# Patient Record
Sex: Female | Born: 1980
Health system: Southern US, Community
[De-identification: ages and names within clinical notes are randomized; demographics above are authoritative.]

## PROBLEM LIST (undated history)

## (undated) DIAGNOSIS — IMO0002 Reserved for concepts with insufficient information to code with codable children: Secondary | ICD-10-CM

## (undated) DIAGNOSIS — B019 Varicella without complication: Secondary | ICD-10-CM

## (undated) DIAGNOSIS — F1491 Cocaine use, unspecified, in remission: Secondary | ICD-10-CM

## (undated) DIAGNOSIS — F419 Anxiety disorder, unspecified: Secondary | ICD-10-CM

## (undated) DIAGNOSIS — J45909 Unspecified asthma, uncomplicated: Secondary | ICD-10-CM

## (undated) DIAGNOSIS — F1290 Cannabis use, unspecified, uncomplicated: Secondary | ICD-10-CM

## (undated) DIAGNOSIS — Z9289 Personal history of other medical treatment: Secondary | ICD-10-CM

## (undated) DIAGNOSIS — F32A Depression, unspecified: Secondary | ICD-10-CM

## (undated) DIAGNOSIS — F17210 Nicotine dependence, cigarettes, uncomplicated: Secondary | ICD-10-CM

## (undated) DIAGNOSIS — E538 Deficiency of other specified B group vitamins: Secondary | ICD-10-CM

## (undated) DIAGNOSIS — E559 Vitamin D deficiency, unspecified: Secondary | ICD-10-CM

## (undated) DIAGNOSIS — F329 Major depressive disorder, single episode, unspecified: Secondary | ICD-10-CM

## (undated) HISTORY — DX: Reserved for concepts with insufficient information to code with codable children: IMO0002

## (undated) HISTORY — DX: Cannabis use, unspecified, uncomplicated: F12.90

## (undated) HISTORY — DX: Varicella without complication: B01.9

## (undated) HISTORY — DX: Personal history of other medical treatment: Z92.89

## (undated) HISTORY — DX: Depression, unspecified: F32.A

## (undated) HISTORY — DX: Unspecified asthma, uncomplicated: J45.909

## (undated) HISTORY — DX: Cocaine use, unspecified, in remission: F14.91

## (undated) HISTORY — DX: Major depressive disorder, single episode, unspecified: F32.9

## (undated) HISTORY — PX: WISDOM TOOTH EXTRACTION: SHX21

## (undated) HISTORY — PX: CYSTECTOMY: SUR359

## (undated) HISTORY — DX: Vitamin D deficiency, unspecified: E55.9

## (undated) HISTORY — DX: Deficiency of other specified B group vitamins: E53.8

## (undated) HISTORY — DX: Nicotine dependence, cigarettes, uncomplicated: F17.210

---
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
## 2000-01-21 ENCOUNTER — Encounter: Payer: Self-pay | Admitting: Emergency Medicine

## 2000-01-21 ENCOUNTER — Emergency Department (HOSPITAL_COMMUNITY): Admission: EM | Admit: 2000-01-21 | Discharge: 2000-01-21 | Payer: Self-pay | Admitting: Emergency Medicine

## 2011-01-23 DIAGNOSIS — R87619 Unspecified abnormal cytological findings in specimens from cervix uteri: Secondary | ICD-10-CM

## 2011-01-23 DIAGNOSIS — IMO0002 Reserved for concepts with insufficient information to code with codable children: Secondary | ICD-10-CM

## 2011-01-23 HISTORY — DX: Unspecified abnormal cytological findings in specimens from cervix uteri: R87.619

## 2011-01-23 HISTORY — DX: Reserved for concepts with insufficient information to code with codable children: IMO0002

## 2011-04-12 ENCOUNTER — Other Ambulatory Visit (HOSPITAL_COMMUNITY)
Admission: RE | Admit: 2011-04-12 | Discharge: 2011-04-12 | Disposition: A | Discharge status: Home / Self Care | Payer: Self-pay | Source: Ambulatory Visit | Attending: Physician Assistant | Admitting: Physician Assistant

## 2011-04-12 ENCOUNTER — Encounter: Payer: Self-pay | Admitting: Physician Assistant

## 2011-04-12 ENCOUNTER — Ambulatory Visit (INDEPENDENT_AMBULATORY_CARE_PROVIDER_SITE_OTHER): Payer: Self-pay | Admitting: Physician Assistant

## 2011-04-12 VITALS — BP 133/80 | HR 92 | Temp 97.2°F | Ht 62.0 in | Wt 117.0 lb

## 2011-04-12 DIAGNOSIS — Z01812 Encounter for preprocedural laboratory examination: Secondary | ICD-10-CM

## 2011-04-12 DIAGNOSIS — IMO0002 Reserved for concepts with insufficient information to code with codable children: Secondary | ICD-10-CM

## 2011-04-12 DIAGNOSIS — D069 Carcinoma in situ of cervix, unspecified: Secondary | ICD-10-CM | POA: Insufficient documentation

## 2011-04-12 DIAGNOSIS — N871 Moderate cervical dysplasia: Secondary | ICD-10-CM | POA: Insufficient documentation

## 2011-04-12 DIAGNOSIS — R87613 High grade squamous intraepithelial lesion on cytologic smear of cervix (HGSIL): Secondary | ICD-10-CM

## 2011-04-12 HISTORY — DX: High grade squamous intraepithelial lesion on cytologic smear of cervix (HGSIL): R87.613

## 2011-04-12 HISTORY — DX: Reserved for concepts with insufficient information to code with codable children: IMO0002

## 2011-04-12 LAB — POCT PREGNANCY, URINE: Preg Test, Ur: NEGATIVE

## 2011-04-12 NOTE — Progress Notes (Signed)
Referred from [facility] for abnormal pap. 02/2011 pap showed HSIL. Pt reports hx HSIL 2010 Claris Gower w/o tx) Patient given informed consent, signed copy in the chart, time out was performed.  Placed in lithotomy position. Cervix viewed with speculum and colposcope after application of acetic acid.   Colposcopy adequate?  Yes  Acetowhite lesions?Yes Punctation?Yes Mosaicism?  Yes; from 6-9 o'clock Abnormal vasculature?  Yes; 12 o'clock Biopsies?Yes x 2 ECC?Yes  COMMENTS:Suspect CIN 3 Patient was given post procedure instructions.  She will return in 2 weeks for results. Discussed with pt probable CIN 3, suspect need for LEEP.

## 2011-04-12 NOTE — Patient Instructions (Signed)
Colposcopy Care After Colposcopy is a procedure in which a special tool is used to magnify the surface of the cervix. A tissue sample (biopsy) may also be taken. This sample will be looked at for cervical cancer or other problems. After the test:  You may have some cramping.   Lie down for a few minutes if you feel lightheaded.    You may have some bleeding which should stop in a few days.  HOME CARE  Do not have sex or use tampons for 2 to 3 days or as told.   Only take medicine as told by your doctor.   Continue to take your birth control pills as usual.  Finding out the results of your test Ask when your test results will be ready. Make sure you get your test results. GET HELP RIGHT AWAY IF:  You are bleeding a lot or are passing blood clots.   You develop a fever of 102 F (38.9 C) or higher.   You have abnormal vaginal discharge.   You have cramps that do not go away with medicine.   You feel lightheaded, dizzy, or pass out (faint).  MAKE SURE YOU:   Understand these instructions.   Will watch your condition.   Will get help right away if you are not doing well or get worse.  Document Released: 10/27/2007 Document Revised: 01/20/2011 Document Reviewed: 10/27/2007 ExitCare Patient Information 2012 [employer]. 

## 2011-05-10 ENCOUNTER — Encounter: Payer: Self-pay | Admitting: Family Medicine

## 2011-05-10 ENCOUNTER — Ambulatory Visit (INDEPENDENT_AMBULATORY_CARE_PROVIDER_SITE_OTHER): Payer: Self-pay | Admitting: Family Medicine

## 2011-05-10 VITALS — BP 121/76 | HR 81 | Temp 97.1°F | Ht 62.0 in | Wt 118.1 lb

## 2011-05-10 DIAGNOSIS — D069 Carcinoma in situ of cervix, unspecified: Secondary | ICD-10-CM

## 2011-05-10 NOTE — Progress Notes (Signed)
Discussed colpo results: CIN 2-3.  Had some spotting and discharge for 3 days after colpo.  Nothing since.    Vitals reviewed.   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  No other questions.  Will schedule next available leep.

## 2011-05-10 NOTE — Patient Instructions (Signed)
Loop Electrosurgical Excision Procedure Loop electrosurgical excision procedure (LEEP) is the removal of a portion of the lower part of the uterus (cervix). The procedure is done when there are significantly abnormal cervical cell changes. Abnormal cell changes of the cervix can lead to cancer if left in place and untreated.  The LEEP procedure itself typically only takes a few minutes. Often, it may be done in your caregiver's office. The procedure is considered safe for those who wish to get pregnant or are trying to get pregnant. Only under rare circumstances should this procedure be done if you are pregnant. LET YOUR CAREGIVER KNOW ABOUT:  Whether you are pregnant or late for your last menstrual period.   Allergies to foods or medicines.   All the medicines you are taking includingherbs, eyedrops, and over-the-counter medicines, and creams.   Use of steroids (by mouth or creams).   Previous problems with anesthetics or numbing medicine.   Previous gynecological surgery.   History of blood clots or bleeding problems.   Any recent or current vaginal infections (herpes, sexually transmitted infections).   Other health problems.  RISKS AND COMPLICATIONS  Bleeding.   Infection.   Injury to the vagina, bladder, or rectum.   Very rare obstruction of the cervical opening that causes problems during menstruation (cervical stenosis).  BEFORE THE PROCEDURE  Do not take aspirin or blood thinners (anticoagulants) for 1 week before the procedure, or as told by your caregiver.   Eat a light meal before the procedure.   Ask your caregiver about changing or stopping your regular medicines.   You may be given a pain reliever 1 or 2 hours before the procedure.  PROCEDURE   A tool (speculum) is placed in the vagina. This allows your caregiver to see the cervix.   An iodine stain is applied to the cervix to find the area of abnormal cells to be removed.   Medicine is injected to numb  the cervix (local anesthetic).    Electricity is passed through a thin wire loop which is then used to remove (cauterize) a small segment of the affected cervix.   Light electrocautery is used to seal any small blood vessels and prevent bleeding.   A paste may be applied to the cauterized area of the cervix to help prevent bleeding.   The tissue sample is sent to the lab. It is examined under the microscope.  AFTER THE PROCEDURE  Have someone drive you home.   You may have slight to moderate cramping.   You may notice a black vaginal discharge from the paste used on the cervix to prevent bleeding. This is normal.   Watch for excessive bleeding. This requires immediate medical care.   Ask when your test results will be ready. Make sure you get your test results.  Document Released: 07/31/2002 Document Revised: 01/20/2011 Document Reviewed: 10/20/2010 ExitCare Patient Information 2012 [employer]. 

## 2011-05-24 ENCOUNTER — Encounter: Payer: Self-pay | Admitting: Obstetrics & Gynecology

## 2011-06-24 ENCOUNTER — Ambulatory Visit (INDEPENDENT_AMBULATORY_CARE_PROVIDER_SITE_OTHER): Payer: Self-pay | Admitting: Family Medicine

## 2011-06-24 ENCOUNTER — Other Ambulatory Visit (HOSPITAL_COMMUNITY)
Admission: RE | Admit: 2011-06-24 | Discharge: 2011-06-24 | Disposition: A | Discharge status: Home / Self Care | Payer: Self-pay | Source: Ambulatory Visit | Attending: Family Medicine | Admitting: Family Medicine

## 2011-06-24 ENCOUNTER — Encounter: Payer: Self-pay | Admitting: Family Medicine

## 2011-06-24 VITALS — BP 133/74 | HR 73 | Temp 98.4°F | Ht 62.0 in | Wt 112.9 lb

## 2011-06-24 DIAGNOSIS — D069 Carcinoma in situ of cervix, unspecified: Secondary | ICD-10-CM

## 2011-06-24 DIAGNOSIS — N871 Moderate cervical dysplasia: Secondary | ICD-10-CM | POA: Insufficient documentation

## 2011-06-24 LAB — POCT PREGNANCY, URINE: Preg Test, Ur: NEGATIVE

## 2011-06-24 NOTE — Patient Instructions (Signed)
Loop Electrosurgical Excision Procedure Care After Refer to this sheet in the next few weeks. These instructions provide you with information on caring for yourself after your procedure. Your caregiver may also give you more specific instructions. Your treatment has been planned according to current medical practices, but problems sometimes occur. Call your caregiver if you have any problems or questions after your procedure. HOME CARE INSTRUCTIONS   Do not use tampons, douche, or have sexual intercourse for 2 weeks or as directed by your caregiver.   Begin normal activities if you have no or minimal cramping or bleeding, unless directed otherwise by your caregiver.   Take your temperature if you feel sick. Write down your temperature on paper, and tell your caregiver if you have a fever.   Take all medicines as directed by your caregiver.   Keep all your follow-up appointments and Pap tests as directed by your caregiver.  SEEK IMMEDIATE MEDICAL CARE IF:   You have bleeding that is heavier or longer than a normal menstrual cycle.   You have bleeding that is bright red.   You have blood clots.   You have a fever.   You have increasing cramps or pain not relieved by medicine.   You develop abdominal pain that does not seem to be related to the same area of earlier cramping and pain.   You are lightheaded, unusually weak, or faint.   You develop painful or bloody urination.   You develop a bad smelling vaginal discharge.  MAKE SURE YOU:  Understand these instructions.   Will watch your condition.   Will get help right away if you are not doing well or get worse.  Document Released: 01/21/2011 Document Reviewed: 10/29/2010 ExitCare Patient Information 2012 [employer]. 

## 2011-06-24 NOTE — Progress Notes (Signed)
  LEEP PROCEDURE NOTE Pap smear and colposcopy reviewed.   Pap HSIL Colpo Biopsy CIN 2-3 ECC Neg Risks, benefits, alternatives, and limitations of procedure explained to patient, including pain, bleeding, infection, failure to remove abnormal tissue and failure to cure dysplasia, need for repeat procedures, damage to pelvic organs, cervical incompetence.  Role of HPV,cervical dysplasia and need for close followup was empasized. Informed written consent was obtained. All questions were answered. Time out performed.  ??Procedure: The patient was placed in lithotomy position and the bivalved coated speculum was placed in the patient's vagina. A grounding pad placed on the patient. Lugol's solution was applied to the cervix and areas of decreased uptake were noted 6-7, 11 o'clock.   Local anesthesia was administered via an intracervical block using 10cc of 2% Lidocaine with epinephrine. The suction was turned on and the Large 1X Fisher Cone Biopsy Excisor on 50 Watts of cutting current was used to excise the area of decreased uptake and excise the entire transformation zone. Excellent hemostasis was achieved using roller ball coagulation set at 50 Watts coagulation current. Monsel's solution was then applied and the speculum was removed from the vagina. Specimens were sent to pathology. ?The patient tolerated the procedure well. Post-operative instructions given to patient, including instruction to seek medical attention for persistent bright red bleeding, fever, abdominal/pelvic pain, dysuria, nausea or vomiting. She was also told about the possibility of having copious yellow to black tinged discharge. She was counseled to avoid anything in the vagina (sex/douching/tampons) for 4-6 weeks. She has a  2-week post-operative check to review results and assess wound healing. Follow up in 4 months for repeat pap or as needed.

## 2011-07-26 ENCOUNTER — Encounter: Payer: Self-pay | Admitting: Family Medicine

## 2011-07-26 ENCOUNTER — Ambulatory Visit (INDEPENDENT_AMBULATORY_CARE_PROVIDER_SITE_OTHER): Payer: Self-pay | Admitting: Family Medicine

## 2011-07-26 VITALS — BP 130/73 | HR 84 | Ht 62.0 in | Wt 114.5 lb

## 2011-07-26 DIAGNOSIS — N871 Moderate cervical dysplasia: Secondary | ICD-10-CM

## 2011-07-26 NOTE — Progress Notes (Signed)
  Subjective:    Patient ID: Alexandria Gutierrez Doctor, female    DOB: [birth year], 31 y.o.   MRN: [id number]  HPI Patient seen - no current bleeding, discharge, pain.  Did have discharge for a few days after the LEEP.  No concerns today.   Review of Systems     Objective:   Physical Exam  Constitutional: She is oriented to person, place, and time. She appears well-developed and well-nourished.  Neurological: She is alert and oriented to person, place, and time.  Skin: Skin is warm and dry.  Psychiatric: She has a normal mood and affect. Her behavior is normal. Judgment and thought content normal.       Assessment & Plan:  1.  CIN2 with preceding  Explained results of the LEEP with patient.  Will have patient return for PAP in 6 and 12 months.  If both are negative, would resume normal screening.  If either are ASCUS, LGSIL, or HGSIL, would need colposcopy.

## 2011-07-26 NOTE — Assessment & Plan Note (Deleted)
On LEEP done 06/24/11

## 2012-01-19 ENCOUNTER — Ambulatory Visit: Payer: Self-pay | Admitting: Family Medicine

## 2012-02-14 ENCOUNTER — Encounter: Payer: Self-pay | Admitting: Advanced Practice Midwife

## 2012-02-14 ENCOUNTER — Encounter: Payer: Self-pay | Admitting: *Deleted

## 2012-02-14 ENCOUNTER — Ambulatory Visit (INDEPENDENT_AMBULATORY_CARE_PROVIDER_SITE_OTHER): Payer: Self-pay | Admitting: Advanced Practice Midwife

## 2012-02-14 VITALS — BP 112/73 | HR 68 | Temp 97.9°F | Ht 62.0 in | Wt 119.0 lb

## 2012-02-14 DIAGNOSIS — R6889 Other general symptoms and signs: Secondary | ICD-10-CM

## 2012-02-14 DIAGNOSIS — N871 Moderate cervical dysplasia: Secondary | ICD-10-CM

## 2012-02-14 DIAGNOSIS — IMO0002 Reserved for concepts with insufficient information to code with codable children: Secondary | ICD-10-CM

## 2012-02-14 NOTE — Patient Instructions (Signed)
Pap Test A Pap test is a sampling of cells from a woman's cervix. The cervix is the opening between the vagina (birth canal) and the uterus (the bottom part of the womb). The cells are scraped from the cervix during a pelvic exam. These cells are then looked at under a microscope to see if the cells are normal or to see if a cancer is developing or there are changes that suggest a cancer will develop. Cervical dysplasia is a condition in which a woman has abnormal changes in the top layer of cells of her cervix. These changes are an early sign that cervical cancer may develop. Pap tests also look for the human papilloma virus (HPV) because it has 4 types that are responsible for 70% of cervical cancer. Infections can also be found during a Pap test such as bacteria, fungus, protozoa and viruses.  Cervical cancer is harder to treat and less likely to have a good outcome if left untreated. Catching the disease at an early stage leads to a better outcome. Since the Pap test was introduced 60 years ago, deaths from cervical cancer have decreased by 70%. Every woman should keep up to date with Pap tests. RISK FACTORS FOR CERVICAL CANCER INCLUDE:   Becoming sexually active before age 18.   Being the daughter of a woman who took diethylstilbestrol (DES) during pregnancy.   Having a sexual partner who has or has had cancer of the penis.   Having a sexual partner whose past partner had cervical cancer or cervical dysplasia (early cell changes which suggest a cancer may develop).   Having a weakened immune system. An example would be HIV or other immunodeficiency disorder.   Having had a sexually transmitted infection such as chlamydia, gonorrhea or HPV.   Having had an abnormal Pap or cancer of the vagina or vulva.   Having had more than one sexual partner.   A history of cervical cancer in a woman's sister or mother.   Not using condoms with new sexual partners.   Smoking.  WHO SHOULD HAVE PAP  TESTS  A Pap test is done to screen for cervical cancer.   The first Pap test should be done at age 21.   Between ages 21 and 29, Pap tests are repeated every 2 years.   Beginning at age 30, you are advised to have a Pap test every 3 years as long as your past 3 Pap tests have been normal.   Some women have medical problems that increase the chance of getting cervical cancer. Talk to your caregiver about these problems. It is especially important to talk to your caregiver if a new problem develops soon after your last Pap test. In these cases, your caregiver may recommend more frequent screening and Pap tests.   The above recommendations are the same for women who have or have not gotten the vaccine for HPV (Human Papillomavirus).   If you had a hysterectomy for a problem that was not a cancer or a condition that could lead to cancer, then you no longer need Pap tests. However, even if you no longer need a Pap test, a regular exam is a good idea to make sure no other problems are starting.    If you are between ages 65 and 70, and you have had normal Pap tests going back 10 years, you no longer need Pap tests. However, even if you no longer need a Pap test, a regular exam is a good idea   to make sure no other problems are starting.    If you have had past treatment for cervical cancer or a condition that could lead to cancer, you need Pap tests and screening for cancer for at least 20 years after your treatment.   If Pap tests have been discontinued, risk factors (such as a new sexual partner) need to be re-assessed to determine if screening should be resumed.   Some women may need screenings more often if they are at high risk for cervical cancer.  PREPARATION FOR A PAP TEST A Pap test should be performed during the weeks before the start of menstruation. Women should not douche or have sexual intercourse for 24 hours before the test. No vaginal creams, diaphragms, or tampons should be  used for 24 hours before the test. To minimize discomfort, a woman should empty her bladder just before the exam. TAKING THE PAP TEST The caregiver will perform a pelvic exam. A metal or plastic instrument (speculum) is placed in the vagina. This is done before your caregiver does a bimanual exam of your internal female organs. This instrument allows your caregiver to see the inside of the vagina and look at the cervix. A small, sterile brush is used to take a sample of cells from the internal opening of the cervix. A small wooden spatula is used to scrape the outside of the cervix. Neither of these two methods to collect cells will cause you pain. These two scrapings are placed on a glass slide or in a small bottle filled with a special liquid. The cells are looked at later under a microscope in a lab. A specialist will look at these cells and determine if the cells are normal. RESULTS OF YOUR PAP TEST  A healthy Pap test shows no abnormal cells or evidence of inflammation.   The presence of abnormally growing cells on the surface of the cervix may be reported as an abnormal Pap test. Different categories of findings are used to describe your Pap test. Your caregiver will go over the importance of these findings with you. The caregiver will then determine what follow-up is needed or when you should have your next pap test.   If you have had two or more abnormal Pap tests:   You may be asked to have a colposcopy. This is a test in which the cervix is viewed with a special lighted microscope.   A cervical tissue sample (biopsy) may also be needed. This involves taking a small tissue sample from the cervix. The sample is looked at under a microscope to find the cause of the abnormal cells. Make sure you find out the results of the Pap test. If you have not received the results within two weeks, contact your caregiver's office for the results. Do not assume everything is normal if you have not heard from  your caregiver or medical facility. It is important to follow up on all of your test results.  Document Released: 07/31/2002 Document Revised: 04/29/2011 Document Reviewed: 05/04/2011 Exit[employer] Patient Information 2012 [employer]. 

## 2012-02-14 NOTE — Progress Notes (Signed)
  Subjective:    Patient ID: Alexandria Gutierrez Doctor, female    DOB: 1980-08-31, 31 y.o.   MRN: [id number]  HPI This is a 31 y.o. female who presents for repeat pap smear. She was seen here on 06/24/11 for a LEEP procedure with Dr Adrian Blackwater. Her previous pap had shown CIN 2-3.  She has no complaints. Review of Systems Negative.    Objective:   Physical Exam EGBUS: Normal, without discharge or lesions Vagina: pink without lesions Cervix:  Pink, no lesions, there is a small amount of menstrual blood  Pap obtained with broom and brush     Assessment & Plan:  A:  History of CIN 2-3      S/P LEEP 7 months ago  P:  Pap sent      Repeat in 6 months.

## 2012-02-18 ENCOUNTER — Encounter: Payer: Self-pay | Admitting: Advanced Practice Midwife

## 2012-03-30 ENCOUNTER — Encounter (INDEPENDENT_AMBULATORY_CARE_PROVIDER_SITE_OTHER): Payer: Self-pay | Admitting: General Surgery

## 2012-03-30 ENCOUNTER — Ambulatory Visit (INDEPENDENT_AMBULATORY_CARE_PROVIDER_SITE_OTHER): Payer: Self-pay | Admitting: General Surgery

## 2012-03-30 VITALS — BP 128/82 | HR 76 | Temp 97.6°F | Resp 16 | Ht 62.0 in | Wt 117.0 lb

## 2012-03-30 DIAGNOSIS — N6019 Diffuse cystic mastopathy of unspecified breast: Secondary | ICD-10-CM

## 2012-03-30 NOTE — Progress Notes (Signed)
Subjective:     Patient ID: Alexandria Gutierrez, female   DOB: 30-Sep-1980, 31 y.o.   MRN: [id number]  HPI This patient presents today for evaluation of a palpable left breast mass first noticed last night by her husband. She says that she has noticed that her breasts have been more tender bilaterally for the last month and she has had some increased tenderness in the area but she felt that this was likely due to her menstrual cycle. She does do her self breast exams and has not noticed any changes prior to this. She does not take any hormones in she denies any family history of breast cancer. However, she does say that her mother has a history of fibrocystic breasts.  She does drink a significant amount of caffeine and is a mild smoker. Otherwise she denies any abnormal weight loss or systemic changes  Review of Systems     Objective:   Physical Exam On exam I do not appreciate any dominant mass but she does have some fullness in the area of the upper outer left breast in the area of concern. I think that this is just normal breast tissue as it does not appear to be a well-defined mass. This area is mobile and is mildly tender. This is just at the lateral border of the pectoralis muscle. I do not appreciate any other dominant masses or skin changes and there is no evidence of any lymphadenopathy. I performed a bedside ultrasound with high-frequency probe and she did have some fibrocystic breast changes both no dominant mass was seen in the area of concern. There is no visible cyst or hypo-echoic lesions.    Assessment:     Fibrocystic breast I think that this is most likely fibrocystic changes and did not appreciate a dominant mass on exam or ultrasound. However, since she does do her breast exam and has not noticed this area before, I recommended that we do a diagnostic mammogram and ultrasound in the area of concern. Recommend to continue monthly self breast exams and wearing a sports bra during the  times of tenderness. We discussed smoking and caffeine as a potential cause for breast discomfort    Plan:     We will go ahead and try to set her up for diagnostic mammograms and left breast ultrasound to further evaluate for any definite mass. 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

## 2012-04-07 ENCOUNTER — Other Ambulatory Visit: Payer: Self-pay

## 2012-06-06 ENCOUNTER — Telehealth: Payer: Self-pay | Admitting: Oncology

## 2012-06-06 NOTE — Telephone Encounter (Signed)
I left a message to schedule a consult.

## 2012-06-15 ENCOUNTER — Ambulatory Visit
Admission: RE | Admit: 2012-06-15 | Discharge: 2012-06-15 | Disposition: A | Discharge status: Home / Self Care | Payer: BC Managed Care – PPO | Source: Ambulatory Visit | Attending: General Surgery | Admitting: General Surgery

## 2012-06-15 DIAGNOSIS — N6019 Diffuse cystic mastopathy of unspecified breast: Secondary | ICD-10-CM

## 2012-10-13 ENCOUNTER — Other Ambulatory Visit (INDEPENDENT_AMBULATORY_CARE_PROVIDER_SITE_OTHER): Payer: Self-pay | Admitting: Surgery

## 2012-10-13 ENCOUNTER — Other Ambulatory Visit (INDEPENDENT_AMBULATORY_CARE_PROVIDER_SITE_OTHER): Payer: Self-pay

## 2012-10-13 ENCOUNTER — Ambulatory Visit
Admission: RE | Admit: 2012-10-13 | Discharge: 2012-10-13 | Disposition: A | Discharge status: Home / Self Care | Payer: Self-pay | Source: Ambulatory Visit | Attending: Surgery | Admitting: Surgery

## 2012-10-13 DIAGNOSIS — R7611 Nonspecific reaction to tuberculin skin test without active tuberculosis: Secondary | ICD-10-CM

## 2012-10-18 LAB — QUANTIFERON TB GOLD ASSAY (BLOOD)
Mitogen value: 5.93 IU/mL
Quantiferon Tb Ag Minus Nil Value: 3.42 IU/mL

## 2012-10-19 ENCOUNTER — Other Ambulatory Visit (INDEPENDENT_AMBULATORY_CARE_PROVIDER_SITE_OTHER): Payer: Self-pay

## 2012-10-19 ENCOUNTER — Other Ambulatory Visit (INDEPENDENT_AMBULATORY_CARE_PROVIDER_SITE_OTHER): Payer: Self-pay | Admitting: Surgery

## 2012-10-19 DIAGNOSIS — R7611 Nonspecific reaction to tuberculin skin test without active tuberculosis: Secondary | ICD-10-CM

## 2012-10-23 LAB — QUANTIFERON TB GOLD ASSAY (BLOOD)
Interferon Gamma Release Assay: POSITIVE — AB
Mitogen value: >10 IU/mL
Quantiferon Nil Value: 0.05 IU/mL

## 2012-12-07 ENCOUNTER — Ambulatory Visit (INDEPENDENT_AMBULATORY_CARE_PROVIDER_SITE_OTHER): Payer: BC Managed Care – PPO | Admitting: General Practice

## 2012-12-07 DIAGNOSIS — Z3201 Encounter for pregnancy test, result positive: Secondary | ICD-10-CM

## 2012-12-07 DIAGNOSIS — Z3200 Encounter for pregnancy test, result unknown: Secondary | ICD-10-CM

## 2012-12-29 LAB — OB RESULTS CONSOLE HEPATITIS B SURFACE ANTIGEN: HEP B S AG: NEGATIVE

## 2012-12-29 LAB — OB RESULTS CONSOLE RPR: RPR: NONREACTIVE

## 2012-12-29 LAB — OB RESULTS CONSOLE HIV ANTIBODY (ROUTINE TESTING): HIV: NONREACTIVE

## 2012-12-29 LAB — OB RESULTS CONSOLE ABO/RH: RH TYPE: NEGATIVE

## 2012-12-29 LAB — OB RESULTS CONSOLE GC/CHLAMYDIA
CHLAMYDIA, DNA PROBE: NEGATIVE
GC PROBE AMP, GENITAL: NEGATIVE

## 2012-12-29 LAB — OB RESULTS CONSOLE ANTIBODY SCREEN: Antibody Screen: NEGATIVE

## 2012-12-29 LAB — OB RESULTS CONSOLE RUBELLA ANTIBODY, IGM: RUBELLA: IMMUNE

## 2013-01-17 ENCOUNTER — Encounter: Payer: Self-pay | Admitting: Advanced Practice Midwife

## 2013-02-13 ENCOUNTER — Other Ambulatory Visit (HOSPITAL_COMMUNITY): Payer: Self-pay | Admitting: Obstetrics & Gynecology

## 2013-02-13 DIAGNOSIS — R1012 Left upper quadrant pain: Secondary | ICD-10-CM

## 2013-02-14 ENCOUNTER — Ambulatory Visit (HOSPITAL_COMMUNITY)
Admission: RE | Admit: 2013-02-14 | Discharge: 2013-02-14 | Disposition: A | Discharge status: Home / Self Care | Payer: BC Managed Care – PPO | Source: Ambulatory Visit | Attending: Obstetrics & Gynecology | Admitting: Obstetrics & Gynecology

## 2013-02-14 DIAGNOSIS — R1012 Left upper quadrant pain: Secondary | ICD-10-CM

## 2013-02-14 DIAGNOSIS — Z3689 Encounter for other specified antenatal screening: Secondary | ICD-10-CM | POA: Insufficient documentation

## 2013-02-14 DIAGNOSIS — O99891 Other specified diseases and conditions complicating pregnancy: Secondary | ICD-10-CM | POA: Insufficient documentation

## 2013-02-14 IMAGING — US US OB COMP +14 WK
1 series · 12 of 28 positions shown · non-contrast
Comparison: none

[Series 1: us ob comp +14 wk · 12 of 41 slices shown]
[im 2/41]
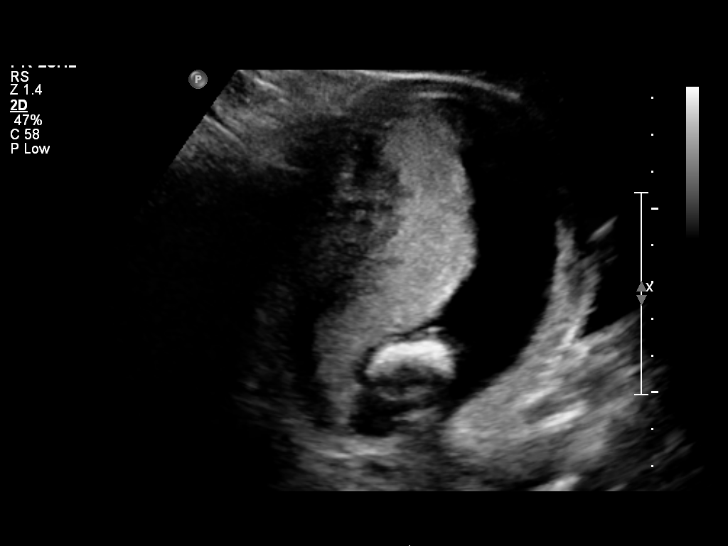
[im 5/41]
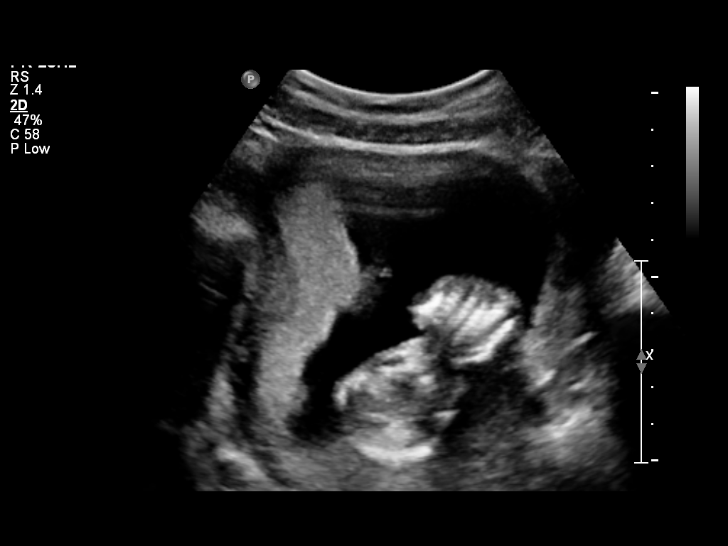
[im 8/41]
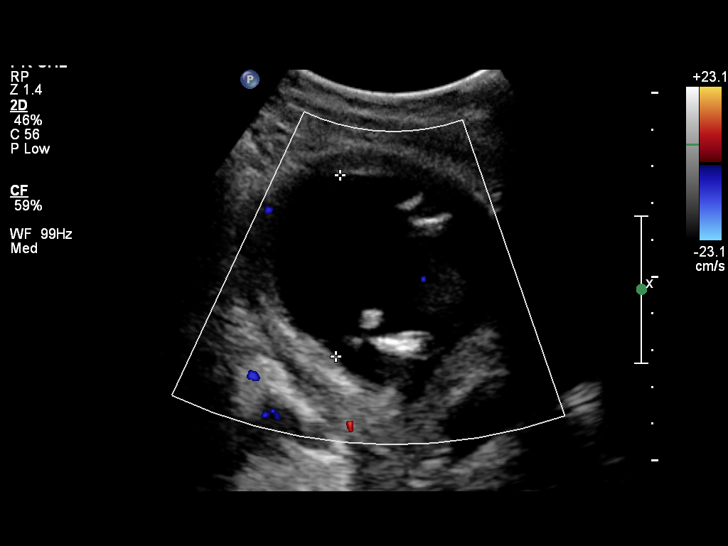
[im 12/41]
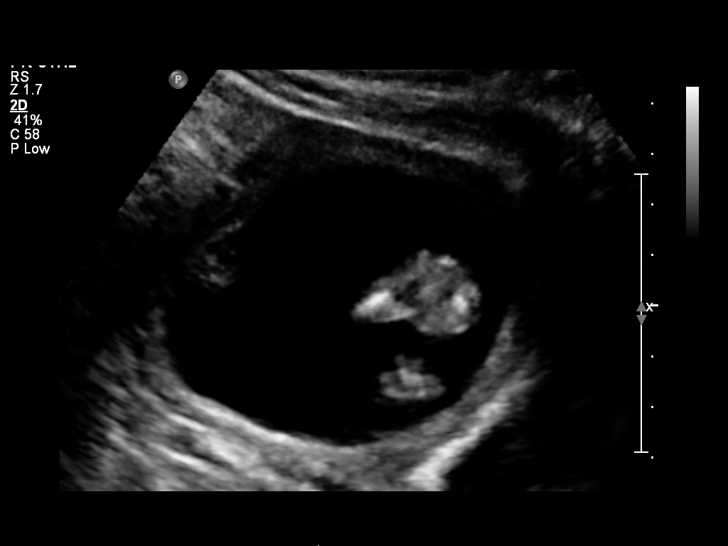
[im 15/41]
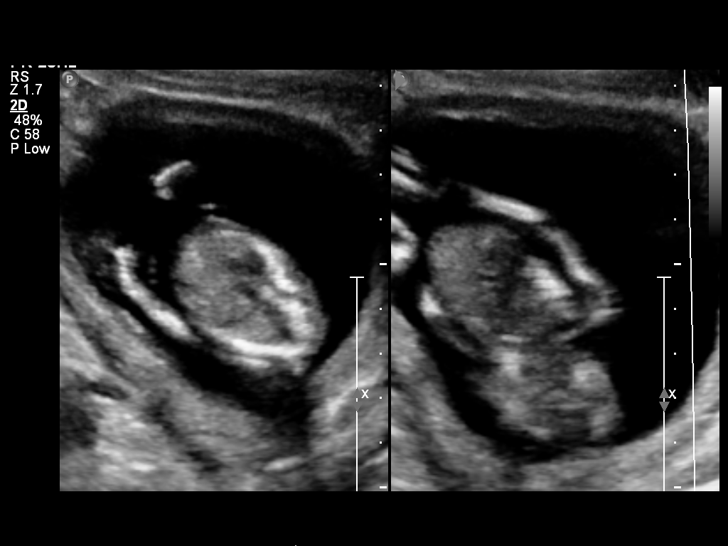
[im 18/41]
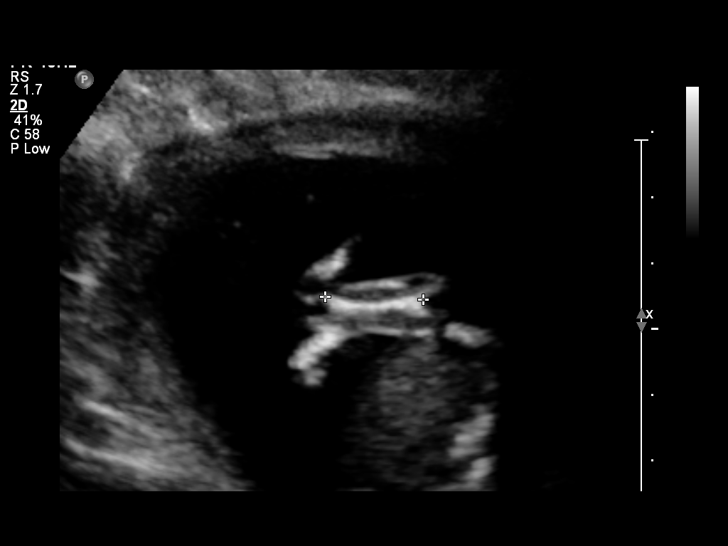
[im 23/41]
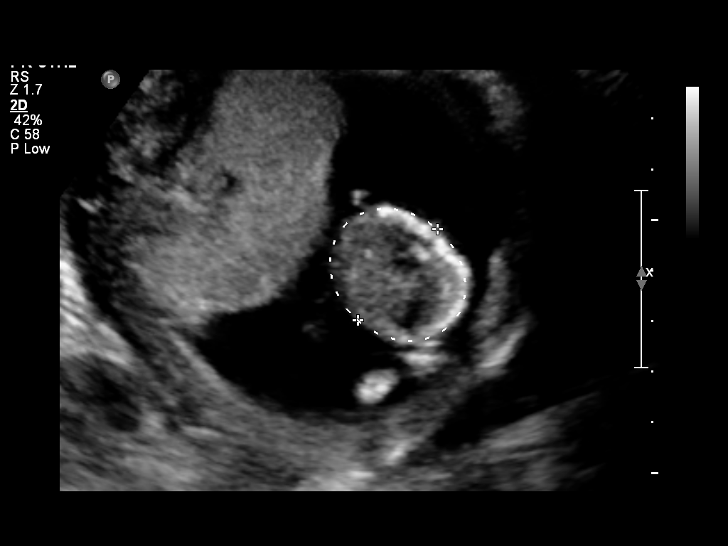
[im 26/41]
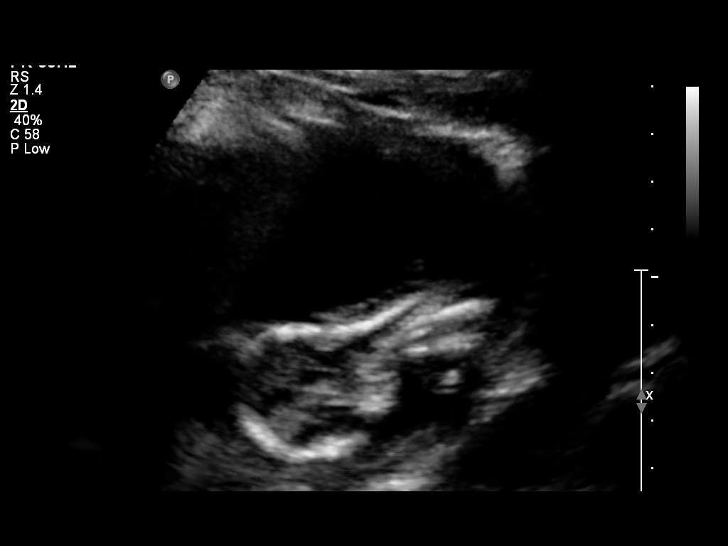
[im 29/41]
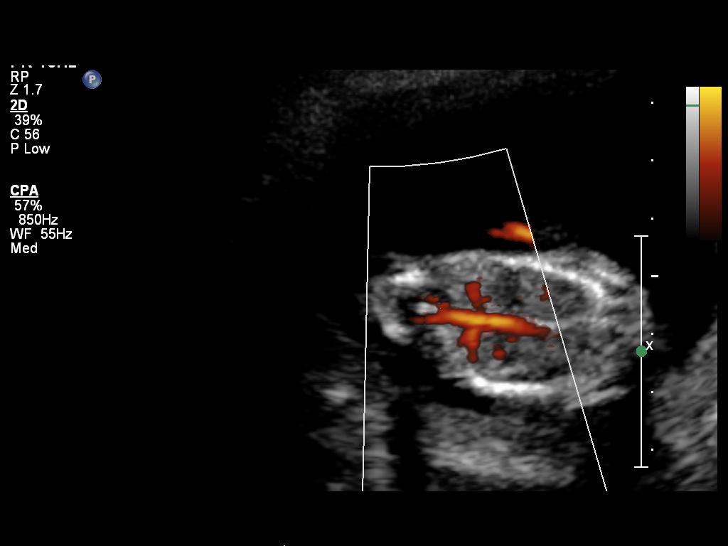
[im 33/41]
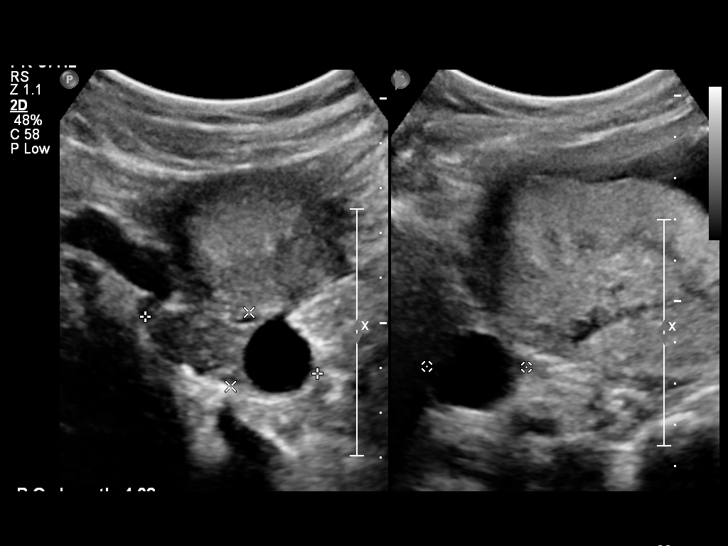
[im 36/41]
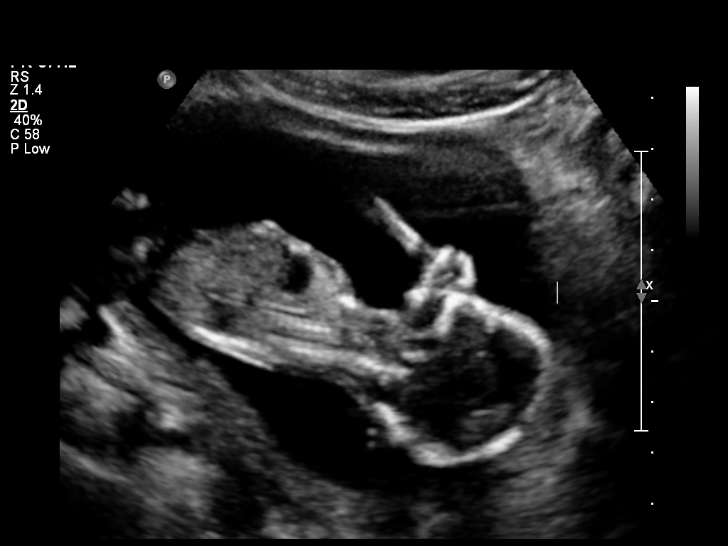
[im 39/41]
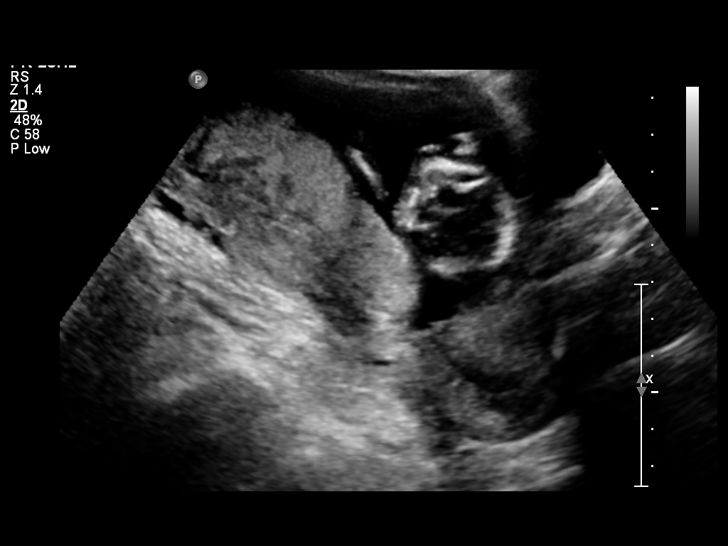

[12 of 28 positions shown; findings below may reference images not displayed]

OBSTETRICS REPORT
                      (Signed Final [DATE] [DATE])

Service(s) Provided

 US OB COMP + 14 WK                                    76805.1
Indications

 Abdominal pain - LUQ
 Basic anatomic survey                                 [23]
Fetal Evaluation

 Num Of Fetuses:    1
 Fetal Heart Rate:  152                          bpm
 Cardiac Activity:  Observed
 Presentation:      Cephalic
 Placenta:          Posterior
 P. Cord            Visualized
 Insertion:

 Amniotic Fluid
 AFI FV:      Subjectively within normal limits
                                             Larg Pckt:     4.9  cm
Biometry

 BPD:     27.1  mm     G. Age:  14w 6d                CI:        67.48   70 - 86
                                                      FL/HC:
 HC:     105.6  mm     G. Age:  15w 0d                HC/AC:      1.28   1.14 -

 AC:      82.4  mm     G. Age:  14w 4d                FL/BPD:
 FL:      14.6  mm     G. Age:  14w 2d                FL/AC:      17.7   20 - 24

 Est. FW:      99  gm      0 lb 3 oz
Gestational Age

 Clinical EDD:  14w 1d                                        EDD:   [DATE]
 U/S Today:     14w 5d                                        EDD:   [DATE]
 Best:          14w 1d     Det. By:  Clinical EDD             EDD:   [DATE]
Anatomy

 Cranium:          Appears normal         Ductal Arch:      Basic anatomy
                                                            exam per order
 Fetal Cavum:      Not well visualized    Diaphragm:        Basic anatomy
                                                            exam per order
 Ventricles:       Not well visualized    Stomach:          Appears normal, left
                                                            sided
 Choroid Plexus:   Appears normal         Abdomen:          Appears normal
 Cerebellum:       Not well visualized    Abdominal Wall:   Not well visualized
 Posterior Fossa:  Not well visualized    Cord Vessels:     Appears normal (3
                                                            vessel cord)
 Nuchal Fold:      Not well visualized    Kidneys:          Appear normal
 Face:             Not well visualized    Bladder:          Appears normal
 Heart:            Not well visualized    Spine:            Not well visualized
 RVOT:             Not well visualized    Lower             Visualized
                                          Extremities:
 LVOT:             Not well visualized    Upper             Visualized
                                          Extremities:
 Aortic Arch:      Basic anatomy
                   exam per order

 Other:  Gender not well visualized.
Cervix Uterus Adnexa

 Cervical Length:    3.4      cm

 Cervix:       Normal appearance by transabdominal scan.

 Left Ovary:    Size(cm) L: 2.4 x W: 1.74 x H: 1.54  Volume(cc):
 Right Ovary:   Size(cm) L: 4.02 x W: 2.41 x H: 1.69  Volume(cc):
 Adnexa:     No abnormality visualized.
Impression

 IUP at 14+1 weeks
 No gross abnormalities identified
 Normal amniotic fluid volume
 Measurements consistent with stated EDC
 Adnexa appeared normal bilaterally; small cyst on right ovary
Recommendations

 Offer anatomy U/S by 18 weeks
 Follow-up as clinically indicated

## 2013-05-24 NOTE — L&D Delivery Note (Signed)
Delivery Note  First Stage: Labor onset: 2119 Augmentation : AROM Analgesia Alexandria Gutierrez/Anesthesia intrapartum: epidural AROM at 0322  Second Stage: Complete dilation at 0322 Onset of pushing at 0322 FHR second stage 120, moderate variability, variable decels  Delivery of a viable female at [id number] by CNM in OA position loose nuchal cord x1, reduced Cord double clamped after cessation of pulsation, cut by father of baby Cord blood sample collected   Collection of cord blood donation n/a Arterial cord blood sample n/a  Third Stage: Placenta delivered via Duncan intact with 3 VC @ 0341 Placenta disposition: routine disposal Uterine tone firm / bleeding small  Left labial laceration identified Anesthesia for repair: regional Repaired with 3-0 Vicryl rapide, running stitch, and figure of eight used at inferior edge d/t bleeding Periurethral laceration identified-small, hemostatic-not repaired  Est. Blood Loss (mL): 350  Complications: none  Mom to postpartum.  Baby to Couplet care / Skin to Skin.  Newborn: Birth Weight: pending  Apgar Scores: 8/9 Feeding planned: breast  Alexandria Gutierrez, Alexandria Gutierrez, N MSN, CNM 08/04/2013, 4:29 AM

## 2013-07-23 LAB — OB RESULTS CONSOLE GBS: GBS: POSITIVE

## 2013-08-03 ENCOUNTER — Encounter (HOSPITAL_COMMUNITY): Payer: Self-pay | Admitting: *Deleted

## 2013-08-03 ENCOUNTER — Inpatient Hospital Stay (HOSPITAL_COMMUNITY)
Admission: AD | Admit: 2013-08-03 | Discharge: 2013-08-06 | DRG: 775 | Disposition: A | Discharge status: Home / Self Care | Payer: BC Managed Care – PPO | Source: Ambulatory Visit | Attending: Obstetrics & Gynecology | Admitting: Obstetrics & Gynecology

## 2013-08-03 DIAGNOSIS — Z22330 Carrier of Group B streptococcus: Secondary | ICD-10-CM | POA: Diagnosis not present

## 2013-08-03 DIAGNOSIS — O99892 Other specified diseases and conditions complicating childbirth: Secondary | ICD-10-CM | POA: Diagnosis present

## 2013-08-03 DIAGNOSIS — O479 False labor, unspecified: Secondary | ICD-10-CM | POA: Diagnosis present

## 2013-08-03 DIAGNOSIS — O99334 Smoking (tobacco) complicating childbirth: Secondary | ICD-10-CM | POA: Diagnosis present

## 2013-08-03 DIAGNOSIS — O9989 Other specified diseases and conditions complicating pregnancy, childbirth and the puerperium: Secondary | ICD-10-CM

## 2013-08-03 LAB — CBC
HEMATOCRIT: 34.8 % — AB (ref 36.0–46.0)
Hemoglobin: 11.9 g/dL — ABNORMAL LOW (ref 12.0–15.0)
MCH: 29.8 pg (ref 26.0–34.0)
MCHC: 34.2 g/dL (ref 30.0–36.0)
MCV: 87 fL (ref 78.0–100.0)
PLATELETS: 209 10*3/uL (ref 150–400)
RBC: 4 MIL/uL (ref 3.87–5.11)
RDW: 13.6 % (ref 11.5–15.5)
WBC: 12.3 10*3/uL — AB (ref 4.0–10.5)

## 2013-08-03 MED ORDER — CITRIC ACID-SODIUM CITRATE 334-500 MG/5ML PO SOLN: 30.0000 mL | ORAL | Status: DC | PRN

## 2013-08-03 MED ORDER — ONDANSETRON HCL 4 MG/2ML IJ SOLN
4.0000 mg | Freq: Four times a day (QID) | INTRAMUSCULAR | Status: DC | PRN
  Administered 2013-08-04: 4 mg via INTRAVENOUS
  Filled 2013-08-03: qty 2

## 2013-08-03 MED ORDER — FENTANYL 2.5 MCG/ML BUPIVACAINE 1/10 % EPIDURAL INFUSION (WH - ANES)
14.0000 mL/h | INTRAMUSCULAR | Status: DC | PRN
  Administered 2013-08-04: 14 mL/h via EPIDURAL
  Filled 2013-08-03: qty 125

## 2013-08-03 MED ORDER — OXYTOCIN 10 UNIT/ML IJ SOLN
10.0000 [IU] | Freq: Once | INTRAMUSCULAR | Status: DC
  Filled 2013-08-03: qty 1

## 2013-08-03 MED ORDER — OXYTOCIN BOLUS FROM INFUSION
500.0000 mL | INTRAVENOUS | Status: DC
Start: 2013-08-03 — End: 2013-08-04
  Administered 2013-08-04: 500 mL via INTRAVENOUS

## 2013-08-03 MED ORDER — FENTANYL CITRATE 0.05 MG/ML IJ SOLN
100.0000 ug | INTRAMUSCULAR | Status: DC | PRN
  Administered 2013-08-03: 100 ug via INTRAVENOUS
  Filled 2013-08-03: qty 2

## 2013-08-03 MED ORDER — EPHEDRINE 5 MG/ML INJ
10.0000 mg | INTRAVENOUS | Status: DC | PRN
  Filled 2013-08-03: qty 2

## 2013-08-03 MED ORDER — IBUPROFEN 600 MG PO TABS: 600.0000 mg | ORAL_TABLET | Freq: Four times a day (QID) | ORAL | Status: DC | PRN

## 2013-08-03 MED ORDER — PENICILLIN G POTASSIUM 5000000 UNITS IJ SOLR
2.5000 10*6.[IU] | INTRAVENOUS | Status: DC
  Administered 2013-08-04: 2.5 10*6.[IU] via INTRAVENOUS
  Filled 2013-08-03 (×4): qty 2.5

## 2013-08-03 MED ORDER — DIPHENHYDRAMINE HCL 50 MG/ML IJ SOLN: 12.5000 mg | INTRAMUSCULAR | Status: DC | PRN

## 2013-08-03 MED ORDER — OXYCODONE-ACETAMINOPHEN 5-325 MG PO TABS: 1.0000 | ORAL_TABLET | ORAL | Status: DC | PRN

## 2013-08-03 MED ORDER — EPHEDRINE 5 MG/ML INJ
10.0000 mg | INTRAVENOUS | Status: DC | PRN
  Filled 2013-08-03: qty 2
  Filled 2013-08-03: qty 4

## 2013-08-03 MED ORDER — LACTATED RINGERS IV SOLN: 500.0000 mL | Freq: Once | INTRAVENOUS | Status: DC

## 2013-08-03 MED ORDER — PHENYLEPHRINE 40 MCG/ML (10ML) SYRINGE FOR IV PUSH (FOR BLOOD PRESSURE SUPPORT)
80.0000 ug | PREFILLED_SYRINGE | INTRAVENOUS | Status: DC | PRN
  Filled 2013-08-03: qty 2

## 2013-08-03 MED ORDER — PENICILLIN G POTASSIUM 5000000 UNITS IJ SOLR
5.0000 10*6.[IU] | Freq: Once | INTRAVENOUS | Status: AC
  Administered 2013-08-03: 5 10*6.[IU] via INTRAVENOUS
  Filled 2013-08-03: qty 5

## 2013-08-03 MED ORDER — OXYTOCIN 40 UNITS IN LACTATED RINGERS INFUSION - SIMPLE MED
62.5000 mL/h | INTRAVENOUS | Status: DC
  Filled 2013-08-03: qty 1000

## 2013-08-03 MED ORDER — LIDOCAINE HCL (PF) 1 % IJ SOLN
30.0000 mL | INTRAMUSCULAR | Status: DC | PRN
  Filled 2013-08-03: qty 30

## 2013-08-03 MED ORDER — PHENYLEPHRINE 40 MCG/ML (10ML) SYRINGE FOR IV PUSH (FOR BLOOD PRESSURE SUPPORT)
80.0000 ug | PREFILLED_SYRINGE | INTRAVENOUS | Status: DC | PRN
  Filled 2013-08-03: qty 10
  Filled 2013-08-03: qty 2

## 2013-08-03 MED ORDER — LACTATED RINGERS IV SOLN
500.0000 mL | INTRAVENOUS | Status: DC | PRN
  Administered 2013-08-03: 1000 mL via INTRAVENOUS

## 2013-08-03 NOTE — H&P (Signed)
  OB ADMISSION/ HISTORY & PHYSICAL:  Admission Date: 08/03/2013  8:41 PM  Admit Diagnosis: 38.[redacted] weeks gestation, active labor, GBS carrier  Alexandria Gutierrez is a 33 y.o. female presenting with regular painful ctx q 10-12 minutes and pelvic pressure since 1700 today. Ctx since arrival have been q4-5 min, increasing intensity, and cervical change over 1.5 hrs.  Prenatal History: G2P1001   EDC:08/14/2013, by Other Basis   [facility]  Primary Ob Provider: Donette LarryMelanie Mitch Arquette, CNM Prenatal course complicated by low-lying placenta-resolved at 26 wks, abnormal GTT with normal 3hr, GBS carrier.   Prenatal Labs: ABO, Rh:   A Neg Antibody:  Neg Rubella:   Immune RPR:   NR HBsAg:   Neg HIV:   Neg GBS: Positive (03/02 0000)  1 hr GTT : 161; 3hr one abnormal value (@2hr )  Medical / Surgical History :  Past medical history:  Past Medical History  Diagnosis Date  . Abnormal finding on Pap smear 01/2011    HSIL  . HSIL (high grade squamous intraepithelial lesion) on Pap smear 04/12/2011     Past surgical history:  Past Surgical History  Procedure Laterality Date  . Wisdom tooth extraction    . Cystectomy  89 or 33 years old    on wrist    Family History:  Family History  Problem Relation Age of Onset  . Alcohol abuse Maternal Aunt     alcohol  . Mental illness Maternal Aunt      Social History:  reports that she has been smoking Cigarettes.  She has been smoking about 0.50 packs per day. She has never used smokeless tobacco. She reports that she drinks alcohol. She reports that she does not use illicit drugs.  Allergies: Erythromycin   Current Medications at time of admission:  Prior to Admission medications   Medication Sig Start Date End Date Taking? Authorizing Provider  calcium carbonate (TUMS - DOSED IN MG ELEMENTAL CALCIUM) 500 MG chewable tablet Chew 3 tablets by mouth daily.   Yes Historical Provider, MD  Prenatal Vit-Fe Fumarate-FA  (PRENATAL MULTIVITAMIN) TABS tablet Take 1 tablet by mouth daily at 12 noon.   Yes Historical Provider, MD     Review of Systems: +FM +painful ctx +pelvic pressure No LOF, VB, or bloody show    Physical Exam:  VS: Blood pressure 134/82, pulse 92, temperature 97.8 F (36.6 C), resp. rate 20, height 5\' 3"  (1.6 m), weight 71.215 kg (157 lb).  General: alert and oriented, appears uncomfortable with ctx Heart: RRR Lungs: Clear lung fields Abdomen: Gravid, soft and non-tender, non-distended / uterus: gravid, non-tender / EFW 7.5 lbs Extremities: no edema  Genitalia / VE: 4/90/-2, BBOW, vtx  FHR: baseline rate 145 / variability moderate / accelerations present / no decelerations TOCO: 2-4, moderate  Assessment: 38.[redacted] weeks gestation First stage of labor FHR category I GBS carrier   Plan:  Admit, GBS prophylaxis, Efm per unit protocol, analgesia/anesthesia prn. Desires IV analgesic now, may want epidural later. Anticipate spontaneous labor progression and SVD. Dr. Juliene PinaMody notified of admission / plan of care   Alexandria Gutierrez, Alexandria Gutierrez, N MSN, CNM 08/03/2013, 10:58 PM

## 2013-08-03 NOTE — Progress Notes (Signed)
Fabian NovemberM. Bhambri CNM on unit and aware of pt's admission and status. Will see pt

## 2013-08-03 NOTE — MAU Note (Signed)
Pt just up to BR and EFM replaced

## 2013-08-03 NOTE — Progress Notes (Signed)
To BS via wlc

## 2013-08-03 NOTE — MAU Note (Signed)
Contractions and bloody mucous on and off all day. Contractions stronger since 1700 and some bloody show.

## 2013-08-04 ENCOUNTER — Inpatient Hospital Stay (HOSPITAL_COMMUNITY): Payer: BC Managed Care – PPO | Admitting: Anesthesiology

## 2013-08-04 ENCOUNTER — Encounter (HOSPITAL_COMMUNITY): Payer: Self-pay

## 2013-08-04 ENCOUNTER — Encounter (HOSPITAL_COMMUNITY): Payer: BC Managed Care – PPO | Admitting: Anesthesiology

## 2013-08-04 LAB — RPR: RPR Ser Ql: NONREACTIVE

## 2013-08-04 LAB — ABO/RH: ABO/RH(D): A NEG

## 2013-08-04 MED ORDER — SODIUM BICARBONATE 8.4 % IV SOLN
INTRAVENOUS | Status: DC | PRN
  Administered 2013-08-04: 5 mL via EPIDURAL

## 2013-08-04 MED ORDER — IBUPROFEN 600 MG PO TABS
600.0000 mg | ORAL_TABLET | Freq: Four times a day (QID) | ORAL | Status: DC
  Administered 2013-08-04 (×3): 600 mg via ORAL
  Filled 2013-08-04 (×5): qty 1

## 2013-08-04 MED ORDER — ONDANSETRON HCL 4 MG/2ML IJ SOLN: 4.0000 mg | INTRAMUSCULAR | Status: DC | PRN

## 2013-08-04 MED ORDER — ONDANSETRON HCL 4 MG PO TABS: 4.0000 mg | ORAL_TABLET | ORAL | Status: DC | PRN

## 2013-08-04 MED ORDER — DIPHENHYDRAMINE HCL 25 MG PO CAPS: 25.0000 mg | ORAL_CAPSULE | Freq: Four times a day (QID) | ORAL | Status: DC | PRN

## 2013-08-04 MED ORDER — SIMETHICONE 80 MG PO CHEW: 80.0000 mg | CHEWABLE_TABLET | ORAL | Status: DC | PRN

## 2013-08-04 MED ORDER — SENNOSIDES-DOCUSATE SODIUM 8.6-50 MG PO TABS
2.0000 | ORAL_TABLET | ORAL | Status: DC
  Administered 2013-08-05 – 2013-08-06 (×2): 2 via ORAL
  Filled 2013-08-04 (×2): qty 2

## 2013-08-04 MED ORDER — LANOLIN HYDROUS EX OINT: TOPICAL_OINTMENT | CUTANEOUS | Status: DC | PRN

## 2013-08-04 MED ORDER — PRENATAL MULTIVITAMIN CH
1.0000 | ORAL_TABLET | Freq: Every day | ORAL | Status: DC
  Administered 2013-08-04 – 2013-08-05 (×2): 1 via ORAL
  Filled 2013-08-04 (×2): qty 1

## 2013-08-04 MED ORDER — TETANUS-DIPHTH-ACELL PERTUSSIS 5-2.5-18.5 LF-MCG/0.5 IM SUSP: 0.5000 mL | Freq: Once | INTRAMUSCULAR | Status: DC

## 2013-08-04 MED ORDER — DIBUCAINE 1 % RE OINT
1.0000 "application " | TOPICAL_OINTMENT | RECTAL | Status: DC | PRN
  Administered 2013-08-05: 1 via RECTAL
  Filled 2013-08-04 (×2): qty 28

## 2013-08-04 MED ORDER — ZOLPIDEM TARTRATE 5 MG PO TABS: 5.0000 mg | ORAL_TABLET | Freq: Every evening | ORAL | Status: DC | PRN

## 2013-08-04 MED ORDER — OXYCODONE-ACETAMINOPHEN 5-325 MG PO TABS: 1.0000 | ORAL_TABLET | ORAL | Status: DC | PRN

## 2013-08-04 MED ORDER — BENZOCAINE-MENTHOL 20-0.5 % EX AERO
1.0000 "application " | INHALATION_SPRAY | CUTANEOUS | Status: DC | PRN
  Administered 2013-08-04: 1 via TOPICAL
  Filled 2013-08-04: qty 56

## 2013-08-04 MED ORDER — WITCH HAZEL-GLYCERIN EX PADS
1.0000 "application " | MEDICATED_PAD | CUTANEOUS | Status: DC | PRN
  Administered 2013-08-04: 1 via TOPICAL

## 2013-08-04 NOTE — Anesthesia Procedure Notes (Signed)

## 2013-08-04 NOTE — Progress Notes (Signed)
Subjective:   Comfortable with epidural, some nausea.  Objective:   VS: Blood pressure 128/78, pulse 69, temperature 97.8 F (36.6 C), temperature source Oral, resp. rate 16, height 5\' 2"  (1.575 m), weight 71.215 kg (157 lb), SpO2 98.00%. FHR : baseline 130 / variability moderate / accelerations present / early decelerations Toco: contractions every 2-4 minutes Cervix : 8/100/0 Membranes: intact   Assessment:  Labor: first stage-active FHR category I  Plan:  Progressing well, AROM after 2nd dose abx, anticipate SVD.     Alexandria Gutierrez, Alexandria Gutierrez, N MSN, CNM 08/04/2013, 2:53 AM

## 2013-08-04 NOTE — Anesthesia Postprocedure Evaluation (Signed)
Anesthesia Post Note  Patient: Alexandria Gutierrez  Procedure(s) Performed: * No procedures listed *  Anesthesia type: Epidural  Patient location: Mother/Baby  Post pain: Pain level controlled  Post assessment: Post-op Vital signs reviewed  Last Vitals:  Filed Vitals:   08/04/13 0629  BP: 125/83  Pulse: 71  Temp: 36.9 C  Resp: 18    Post vital signs: Reviewed  Level of consciousness:alert  Complications: No apparent anesthesia complications

## 2013-08-04 NOTE — Anesthesia Preprocedure Evaluation (Signed)

## 2013-08-04 NOTE — Lactation Note (Signed)
This note was copied from the chart of Alexandria Gutierrez. Lactation Consultation Note; Initial visit with this mom. Baby now 7 hours old. She reports that baby is doing a little better at latching. Baby just finishing bath at present. Placed skin to skin and off to sleep. Mom reports that she last fed about 30 min ago. Reports that first baby never latched well and got formula early. Reports this baby is doing better. Discussed feeding cues and encouraged to feed whenever she sees them. No questions at present. BF brochure given with resources for support after DC. To call for assist prn.   Patient Name: Alexandria Gutierrez Today's Date: 08/04/2013 Reason for consult: Initial assessment   Maternal Data Formula Feeding for Exclusion: No Infant to breast within first hour of birth: Yes Does the patient have breastfeeding experience prior to this delivery?: No  Feeding   LATCH Score/Interventions      Lactation Tools Discussed/Used     Consult Status Consult Status: Follow-up Date: 08/05/13 Follow-up type: In-patient    Pamelia HoitWeeks, Senetra Dillin D 08/04/2013, 11:01 AM

## 2013-08-05 MED ORDER — RHO D IMMUNE GLOBULIN 1500 UNIT/2ML IJ SOLN
300.0000 ug | Freq: Once | INTRAMUSCULAR | Status: AC
  Administered 2013-08-05: 300 ug via INTRAMUSCULAR
  Filled 2013-08-05: qty 2

## 2013-08-05 NOTE — Lactation Note (Signed)
This note was copied from the chart of Alexandria Tyniah Keehan. Lactation Consultation Note  Patient Name: Alexandria Gutierrez Today's Date: 08/05/2013   Visited with Mom and FOB, baby at 3929 hrs old.  Mom states baby latches well, and cluster fed during the night.  Baby sleeping in her crib presently.  Talked about continuing to do frequent skin to skin, and feeding baby on cue.  Basics reviewed. Engorgement prevention and treatment discussed.  Reminded Mom of OP lactation resources available to her.  To call prn for assistance.  Judee ClaraSmith, Urian Martenson E 08/05/2013, 8:46 AM

## 2013-08-05 NOTE — Progress Notes (Signed)
Patient ID: Alexandria Gutierrez, female   DOB: 06/28/1980, 33 y.o.   MRN: [id number] PPD # 1  Subjective: Pt reports feeling well / Pain controlled with rare ibuprofen Tolerating po/ Voiding without problems/ No n/v Bleeding is light Newborn info:  Information for the patient's newborn:  Marilynn RailCostello, Girl Leanne ChangMeagen [[id number]][[id number]]  female Feeding: breast   Objective:  VS: Blood pressure 119/71, pulse 83, temperature 98.5 F (36.9 C), temperature source Oral, resp. rate 18.    Recent Labs  08/03/13 2305  WBC 12.3*  HGB 11.9*  HCT 34.8*  PLT 209    Blood type: A NEG Rubella: Immune    Physical Exam:  General:  alert, cooperative and no distress CV: Regular rate and rhythm Resp: clear Abdomen: soft, nontender, normal bowel sounds Uterine Fundus: firm, below umbilicus, nontender Perineum: healing well Lochia: minimal Ext: Homans sign is negative, no sign of DVT and no edema, redness or tenderness in the calves or thighs   A/P: PPD # 1/ G2P2002/ S/P: SVD Doing well Continue routine post partum orders Anticipate D/C home in AM    Demetrius RevelFISHER,Mandie Crabbe K, MSN, [facility] 08/05/2013, 10:26 AM

## 2013-08-05 NOTE — Lactation Note (Signed)
This note was copied from the chart of Alexandria Eliya Cardoza. Lactation Consultation Note  Patient Name: Alexandria Gutierrez Alexandria Date: 08/05/2013 Reason for consult: Follow-up assessment;Difficult latch;Other (Comment) (RN reports mom having swollen areolar tissue) Mom was given a #24 NS last night to assist with latch and per report of Johnny BridgeMartha, Charity fundraiserN, baby has recently latched and nursed well after some pre-pumping and ebm into NS.  Johnny BridgeMartha reports mom having swollen areolar tissue, so LC provided Gutierrez for mom to wear between feedings and demonstrated use.  LC also provided curved-tip syringes for drawing up ebm and feeding baby, as needed and reviewed Baby and Me milk storage guidelines (page 25).  LC encouraged mom to use any or all of the tools provided to ensure deep/comfortable latch and to feed baby on cue.  Mom is to request further latch assistance as needed.   Maternal Data    Feeding Feeding Type: Breast Fed Length of feed: 10 min  LATCH Score/Interventions Latch: Repeated attempts needed to sustain latch, nipple held in mouth throughout feeding, stimulation needed to elicit sucking reflex. Intervention(s): Adjust position;Assist with latch;Breast massage;Breast compression  Audible Swallowing: Spontaneous and intermittent Intervention(s): Skin to skin;Hand expression;Alternate breast massage  Type of Nipple: Flat Intervention(s): Hand pump;Gutierrez (prepumped 10ml)  Comfort (Breast/Nipple): Soft / non-tender     Hold (Positioning): Assistance needed to correctly position infant at breast and maintain latch. Intervention(s): Breastfeeding basics reviewed;Support Pillows;Position options;Skin to skin  LATCH Score: 7 (most recent feeding assessment by RN)  Lactation Tools Discussed/Used Tools: Nipple Dorris CarnesShields;Gutierrez;Pump;Other (comment) (curved-tip syringes) Nipple shield size: 24 Shell Type: Inverted Pump Review: Setup, frequency, and cleaning;Milk Storage Initiated by::  RN, Johnny BridgeMartha Date initiated:: 08/05/13 Gutierrez to reduce areolar swelling and help nipples evert    Consult Status Consult Status: Follow-up Date: 08/06/13 Follow-up type: In-patient    Warrick ParisianBryant, Eriana Suliman [facility] 08/05/2013, 5:04 PM

## 2013-08-05 NOTE — H&P (Signed)
Reviewed and agree with note and plan. V.Johnwilliam Shepperson, MD  

## 2013-08-06 LAB — RH IG WORKUP (INCLUDES ABO/RH)
ABO/RH(D): A NEG
ANTIBODY SCREEN: NEGATIVE
FETAL SCREEN: NEGATIVE
Gestational Age(Wks): 38.6
UNIT DIVISION: 0

## 2013-08-06 MED ORDER — IBUPROFEN 600 MG PO TABS: 600.0000 mg | ORAL_TABLET | Freq: Four times a day (QID) | ORAL | Status: DC

## 2013-08-06 NOTE — Progress Notes (Signed)
PPD 2 SVD  S:  Reports feeling well             Tolerating po/ No nausea or vomiting             Bleeding is light             Pain controlled with occasional Motrin             Up ad lib / ambulatory / voiding QS  Newborn breast feeding   O:               VS: BP 121/80  Pulse 89  Temp(Src) 98.1 F (36.7 C) (Oral)  Resp 18  Ht 5\' 2"  (1.575 m)  Wt 71.215 kg (157 lb)  BMI 28.71 kg/m2  SpO2 97%  Breastfeeding? Unknown   LABS:              Recent Labs  08/03/13 2305  WBC 12.3*  HGB 11.9*  PLT 209               Blood type: --/--/A NEG (03/15 0557)  Rubella: Immune (08/08 0000)                      Physical Exam:             Alert and oriented X3  Abdomen: soft, non-tender, non-distended              Fundus: firm, non-tender, U-1  Perineum: no edema  Lochia: light  Extremities: trace edema, no calf pain or tenderness   A: PPD # 2   Doing well - stable status  P: Routine post partum orders  DC home - WOB booklet / instructions reviewed  Marlinda MikeBAILEY, [place] Hagger CNM, MSN, [facility]AC[facility] 08/06/2013, 9:45 AM

## 2013-08-06 NOTE — Discharge Summary (Signed)
Obstetric Discharge Summary  Reason for Admission: onset of labor Prenatal Procedures: none Intrapartum Procedures: spontaneous vaginal delivery, GBS prophylaxis and Epidural Postpartum Procedures: none Complications-Operative and Postpartum: 2nd degree perineal laceration Hemoglobin  Date Value Ref Range Status  08/03/2013 11.9* 12.0 - 15.0 g/dL Final     HCT  Date Value Ref Range Status  08/03/2013 34.8* 36.0 - 46.0 % Final    Physical Exam:  General: alert, cooperative and no distress Lochia: appropriate Uterine Fundus: firm Incision: healing well DVT Evaluation: No evidence of DVT seen on physical exam.  Discharge Diagnoses: Term Pregnancy-delivered  Discharge Information: Date: 08/06/2013 Activity: pelvic rest Diet: routine Medications: PNV and Ibuprofen Condition: stable Instructions: refer to practice specific booklet Discharge to: home Follow-up Information   Follow up with BHAMBRI, MELANIE, N, CNM. Schedule an appointment as soon as possible for a visit in 6 weeks. (postpartum exam)    Specialty:  Obstetrics and Gynecology   Contact information:   Enis Gash[address] [place] [id number] [phone]       Newborn Data: Live born female  Birth Weight: 6 lb 11.6 oz (3050 g) APGAR: 8, 9  Home with mother.  Alexandria Gutierrez, Shunte Senseney 08/06/2013, 9:48 AM

## 2013-08-06 NOTE — Lactation Note (Signed)
This note was copied from the chart of Girl Arthella Capasso. Lactation Consultation Note  Patient Name: Girl Horatio PelMeagen Ammons [id number]'s Date: 08/06/2013  Baby just had fed 10min. Prior. Resting well. Mom's breast tight, not engorged. Wearing supportive bra w/nipple shells. Encouraged to pump and massage breast to make comfortable. Bigger flang 27 given. Ice pks. bilaterally to soften breast. Left # to call LC for next feeding. Mom's milk is in and pumping w/o difficulty w/hand pump. Receiving Medlia DEBP from insurance co. Will F/U next feeding.   Maternal Data    Feeding    LATCH Score/Interventions                      Lactation Tools Discussed/Used     Consult Status      Charyl DancerCARVER, Ariyana Faw G 08/06/2013, 9:39 AM

## 2013-08-08 NOTE — Discharge Summary (Signed)
Reviewed and agree with note and plan. V.Alf Doyle, MD  

## 2013-12-04 ENCOUNTER — Encounter: Payer: Self-pay | Admitting: Internal Medicine

## 2013-12-04 NOTE — Progress Notes (Signed)
Patient ID: Alexandria Gutierrez, female   DOB: 11/20/1980, 33 y.o.   MRN: [id number]   R E -  S C H E D U L E

## 2013-12-05 ENCOUNTER — Encounter: Payer: Self-pay | Admitting: Internal Medicine

## 2014-02-06 ENCOUNTER — Ambulatory Visit
Admission: RE | Admit: 2014-02-06 | Discharge: 2014-02-06 | Disposition: A | Discharge status: Home / Self Care | Payer: No Typology Code available for payment source | Source: Ambulatory Visit | Attending: Infectious Disease | Admitting: Infectious Disease

## 2014-02-06 ENCOUNTER — Other Ambulatory Visit: Payer: Self-pay | Admitting: Infectious Disease

## 2014-02-06 DIAGNOSIS — R7612 Nonspecific reaction to cell mediated immunity measurement of gamma interferon antigen response without active tuberculosis: Secondary | ICD-10-CM

## 2014-03-25 ENCOUNTER — Encounter: Payer: Self-pay | Admitting: Internal Medicine

## 2014-12-11 ENCOUNTER — Encounter: Payer: Self-pay | Admitting: Internal Medicine

## 2015-07-31 LAB — OB RESULTS CONSOLE GC/CHLAMYDIA
CHLAMYDIA, DNA PROBE: NEGATIVE
GC PROBE AMP, GENITAL: NEGATIVE

## 2015-07-31 LAB — OB RESULTS CONSOLE ANTIBODY SCREEN: ANTIBODY SCREEN: NEGATIVE

## 2015-07-31 LAB — OB RESULTS CONSOLE HEPATITIS B SURFACE ANTIGEN: Hepatitis B Surface Ag: NEGATIVE

## 2015-07-31 LAB — OB RESULTS CONSOLE HIV ANTIBODY (ROUTINE TESTING): HIV: NONREACTIVE

## 2015-07-31 LAB — OB RESULTS CONSOLE RUBELLA ANTIBODY, IGM: RUBELLA: IMMUNE

## 2015-07-31 LAB — OB RESULTS CONSOLE ABO/RH: RH Type: NEGATIVE

## 2015-07-31 LAB — OB RESULTS CONSOLE RPR: RPR: NONREACTIVE

## 2015-08-18 DIAGNOSIS — R7611 Nonspecific reaction to tuberculin skin test without active tuberculosis: Secondary | ICD-10-CM | POA: Insufficient documentation

## 2015-08-22 DIAGNOSIS — R87619 Unspecified abnormal cytological findings in specimens from cervix uteri: Secondary | ICD-10-CM | POA: Insufficient documentation

## 2016-01-16 ENCOUNTER — Inpatient Hospital Stay (HOSPITAL_COMMUNITY): Payer: Medicaid Other | Admitting: Anesthesiology

## 2016-01-16 ENCOUNTER — Encounter (HOSPITAL_COMMUNITY): Payer: Self-pay | Admitting: *Deleted

## 2016-01-16 ENCOUNTER — Encounter (HOSPITAL_COMMUNITY)
Admission: AD | Disposition: A | Discharge status: Home / Self Care | Payer: Self-pay | Source: Ambulatory Visit | Attending: Obstetrics & Gynecology

## 2016-01-16 ENCOUNTER — Inpatient Hospital Stay (HOSPITAL_COMMUNITY): Payer: Medicaid Other

## 2016-01-16 ENCOUNTER — Inpatient Hospital Stay (HOSPITAL_COMMUNITY)
Admission: AD | Admit: 2016-01-16 | Discharge: 2016-01-18 | DRG: 765 | Disposition: A | Discharge status: Home / Self Care | Payer: Medicaid Other | Source: Ambulatory Visit | Attending: Obstetrics & Gynecology | Admitting: Obstetrics & Gynecology

## 2016-01-16 DIAGNOSIS — Z6791 Unspecified blood type, Rh negative: Secondary | ICD-10-CM | POA: Diagnosis not present

## 2016-01-16 DIAGNOSIS — Z9289 Personal history of other medical treatment: Secondary | ICD-10-CM

## 2016-01-16 DIAGNOSIS — O4693 Antepartum hemorrhage, unspecified, third trimester: Secondary | ICD-10-CM | POA: Diagnosis present

## 2016-01-16 DIAGNOSIS — O4593 Premature separation of placenta, unspecified, third trimester: Secondary | ICD-10-CM | POA: Diagnosis present

## 2016-01-16 DIAGNOSIS — Z3A30 30 weeks gestation of pregnancy: Secondary | ICD-10-CM

## 2016-01-16 DIAGNOSIS — F17210 Nicotine dependence, cigarettes, uncomplicated: Secondary | ICD-10-CM | POA: Diagnosis present

## 2016-01-16 DIAGNOSIS — O26893 Other specified pregnancy related conditions, third trimester: Secondary | ICD-10-CM | POA: Diagnosis present

## 2016-01-16 DIAGNOSIS — O99334 Smoking (tobacco) complicating childbirth: Secondary | ICD-10-CM | POA: Diagnosis present

## 2016-01-16 DIAGNOSIS — Z87410 Personal history of cervical dysplasia: Secondary | ICD-10-CM | POA: Diagnosis not present

## 2016-01-16 DIAGNOSIS — Z98891 History of uterine scar from previous surgery: Secondary | ICD-10-CM

## 2016-01-16 DIAGNOSIS — O26899 Other specified pregnancy related conditions, unspecified trimester: Secondary | ICD-10-CM

## 2016-01-16 DIAGNOSIS — F17200 Nicotine dependence, unspecified, uncomplicated: Secondary | ICD-10-CM

## 2016-01-16 DIAGNOSIS — O4590 Premature separation of placenta, unspecified, unspecified trimester: Secondary | ICD-10-CM | POA: Diagnosis present

## 2016-01-16 HISTORY — DX: Personal history of other medical treatment: Z92.89

## 2016-01-16 LAB — COMPREHENSIVE METABOLIC PANEL
ALK PHOS: 111 U/L (ref 38–126)
ALT: 13 U/L — ABNORMAL LOW (ref 14–54)
ANION GAP: 8 (ref 5–15)
AST: 16 U/L (ref 15–41)
Albumin: 3 g/dL — ABNORMAL LOW (ref 3.5–5.0)
BUN: 9 mg/dL (ref 6–20)
CALCIUM: 8.7 mg/dL — AB (ref 8.9–10.3)
CO2: 20 mmol/L — ABNORMAL LOW (ref 22–32)
Chloride: 106 mmol/L (ref 101–111)
Creatinine, Ser: 0.4 mg/dL — ABNORMAL LOW (ref 0.44–1.00)
GLUCOSE: 86 mg/dL (ref 65–99)
POTASSIUM: 3.5 mmol/L (ref 3.5–5.1)
Sodium: 134 mmol/L — ABNORMAL LOW (ref 135–145)
TOTAL PROTEIN: 6.7 g/dL (ref 6.5–8.1)

## 2016-01-16 LAB — CBC
HCT: 37.9 % (ref 36.0–46.0)
HEMOGLOBIN: 13.4 g/dL (ref 12.0–15.0)
MCH: 31.2 pg (ref 26.0–34.0)
MCHC: 35.4 g/dL (ref 30.0–36.0)
MCV: 88.1 fL (ref 78.0–100.0)
PLATELETS: 194 10*3/uL (ref 150–400)
RBC: 4.3 MIL/uL (ref 3.87–5.11)
RDW: 13.3 % (ref 11.5–15.5)
WBC: 12.5 10*3/uL — AB (ref 4.0–10.5)

## 2016-01-16 LAB — KLEIHAUER-BETKE STAIN
# Vials RhIg: 1
FETAL CELLS %: 0 %
QUANTITATION FETAL HEMOGLOBIN: 0 mL

## 2016-01-16 LAB — LACTATE DEHYDROGENASE: LDH: 111 U/L (ref 98–192)

## 2016-01-16 LAB — PREPARE RBC (CROSSMATCH)

## 2016-01-16 LAB — URIC ACID: URIC ACID, SERUM: 5.3 mg/dL (ref 2.3–6.6)

## 2016-01-16 SURGERY — Surgical Case
Anesthesia: General

## 2016-01-16 MED ORDER — MIDAZOLAM HCL 5 MG/5ML IJ SOLN
INTRAMUSCULAR | Status: DC | PRN
  Administered 2016-01-16: 2 mg via INTRAVENOUS

## 2016-01-16 MED ORDER — PROMETHAZINE HCL 25 MG/ML IJ SOLN
6.2500 mg | INTRAMUSCULAR | Status: DC | PRN
Start: 2016-01-16 — End: 2016-01-16

## 2016-01-16 MED ORDER — LIDOCAINE HCL (CARDIAC) 20 MG/ML IV SOLN
INTRAVENOUS | Status: AC
  Filled 2016-01-16: qty 5

## 2016-01-16 MED ORDER — SCOPOLAMINE 1 MG/3DAYS TD PT72
MEDICATED_PATCH | TRANSDERMAL | Status: DC | PRN
  Administered 2016-01-16: 1 via TRANSDERMAL

## 2016-01-16 MED ORDER — IBUPROFEN 600 MG PO TABS
600.0000 mg | ORAL_TABLET | Freq: Four times a day (QID) | ORAL | Status: DC
  Administered 2016-01-16 – 2016-01-18 (×8): 600 mg via ORAL
  Filled 2016-01-16 (×8): qty 1

## 2016-01-16 MED ORDER — DIPHENHYDRAMINE HCL 25 MG PO CAPS: 25.0000 mg | ORAL_CAPSULE | Freq: Once | ORAL | Status: DC

## 2016-01-16 MED ORDER — COCONUT OIL OIL
1.0000 "application " | TOPICAL_OIL | Status: DC | PRN
  Administered 2016-01-16: 1 via TOPICAL
  Filled 2016-01-16: qty 120

## 2016-01-16 MED ORDER — CALCIUM CARBONATE ANTACID 500 MG PO CHEW: 2.0000 | CHEWABLE_TABLET | ORAL | Status: DC | PRN

## 2016-01-16 MED ORDER — PRENATAL MULTIVITAMIN CH
1.0000 | ORAL_TABLET | Freq: Every day | ORAL | Status: DC
  Administered 2016-01-16 – 2016-01-18 (×3): 1 via ORAL
  Filled 2016-01-16 (×3): qty 1

## 2016-01-16 MED ORDER — KETOROLAC TROMETHAMINE 30 MG/ML IJ SOLN: 30.0000 mg | Freq: Four times a day (QID) | INTRAMUSCULAR | Status: AC | PRN

## 2016-01-16 MED ORDER — OXYCODONE HCL 5 MG PO TABS: 5.0000 mg | ORAL_TABLET | Freq: Four times a day (QID) | ORAL | Status: DC | PRN

## 2016-01-16 MED ORDER — SOD CITRATE-CITRIC ACID 500-334 MG/5ML PO SOLN
ORAL | Status: AC
  Filled 2016-01-16: qty 15

## 2016-01-16 MED ORDER — PROPOFOL 10 MG/ML IV BOLUS
INTRAVENOUS | Status: DC | PRN
  Administered 2016-01-16: 140 mg via INTRAVENOUS

## 2016-01-16 MED ORDER — METHYLERGONOVINE MALEATE 0.2 MG/ML IJ SOLN
INTRAMUSCULAR | Status: DC | PRN
  Administered 2016-01-16: 0.2 mg via INTRAMUSCULAR

## 2016-01-16 MED ORDER — FENTANYL CITRATE (PF) 100 MCG/2ML IJ SOLN
25.0000 ug | INTRAMUSCULAR | Status: DC | PRN
  Administered 2016-01-16: 50 ug via INTRAVENOUS
  Administered 2016-01-16 (×3): 25 ug via INTRAVENOUS

## 2016-01-16 MED ORDER — PHENYLEPHRINE HCL 10 MG/ML IJ SOLN
INTRAMUSCULAR | Status: DC | PRN
  Administered 2016-01-16: 80 ug via INTRAVENOUS
  Administered 2016-01-16: 40 ug via INTRAVENOUS

## 2016-01-16 MED ORDER — DOCUSATE SODIUM 100 MG PO CAPS
100.0000 mg | ORAL_CAPSULE | Freq: Every day | ORAL | Status: DC
  Filled 2016-01-16: qty 1

## 2016-01-16 MED ORDER — FENTANYL CITRATE (PF) 250 MCG/5ML IJ SOLN
INTRAMUSCULAR | Status: AC
  Filled 2016-01-16: qty 5

## 2016-01-16 MED ORDER — SIMETHICONE 80 MG PO CHEW: 80.0000 mg | CHEWABLE_TABLET | ORAL | Status: DC | PRN

## 2016-01-16 MED ORDER — ACETAMINOPHEN 325 MG PO TABS: 650.0000 mg | ORAL_TABLET | ORAL | Status: DC | PRN

## 2016-01-16 MED ORDER — SENNOSIDES-DOCUSATE SODIUM 8.6-50 MG PO TABS
2.0000 | ORAL_TABLET | ORAL | Status: DC
  Administered 2016-01-16 – 2016-01-18 (×2): 2 via ORAL
  Filled 2016-01-16 (×2): qty 2

## 2016-01-16 MED ORDER — NALBUPHINE HCL 10 MG/ML IJ SOLN: 5.0000 mg | INTRAMUSCULAR | Status: DC | PRN

## 2016-01-16 MED ORDER — CARBOPROST TROMETHAMINE 250 MCG/ML IM SOLN
INTRAMUSCULAR | Status: DC | PRN
  Administered 2016-01-16: 250 ug via INTRAMUSCULAR

## 2016-01-16 MED ORDER — PROPOFOL 10 MG/ML IV BOLUS
INTRAVENOUS | Status: AC
  Filled 2016-01-16: qty 20

## 2016-01-16 MED ORDER — OXYTOCIN 40 UNITS IN LACTATED RINGERS INFUSION - SIMPLE MED
INTRAVENOUS | Status: AC
  Filled 2016-01-16: qty 1000

## 2016-01-16 MED ORDER — CARBOPROST TROMETHAMINE 250 MCG/ML IM SOLN
INTRAMUSCULAR | Status: AC
  Filled 2016-01-16: qty 1

## 2016-01-16 MED ORDER — DIPHENHYDRAMINE HCL 50 MG/ML IJ SOLN: 12.5000 mg | INTRAMUSCULAR | Status: DC | PRN

## 2016-01-16 MED ORDER — SODIUM CHLORIDE 0.9% FLUSH: 3.0000 mL | INTRAVENOUS | Status: DC | PRN

## 2016-01-16 MED ORDER — BETAMETHASONE SOD PHOS & ACET 6 (3-3) MG/ML IJ SUSP
12.0000 mg | Freq: Once | INTRAMUSCULAR | Status: AC
  Administered 2016-01-16: 12 mg via INTRAMUSCULAR
  Filled 2016-01-16: qty 2

## 2016-01-16 MED ORDER — NALOXONE HCL 0.4 MG/ML IJ SOLN: 0.4000 mg | INTRAMUSCULAR | Status: DC | PRN

## 2016-01-16 MED ORDER — SUCCINYLCHOLINE CHLORIDE 200 MG/10ML IV SOSY
PREFILLED_SYRINGE | INTRAVENOUS | Status: AC
  Filled 2016-01-16: qty 10

## 2016-01-16 MED ORDER — SIMETHICONE 80 MG PO CHEW
80.0000 mg | CHEWABLE_TABLET | Freq: Three times a day (TID) | ORAL | Status: DC
  Administered 2016-01-16 – 2016-01-18 (×6): 80 mg via ORAL
  Filled 2016-01-16 (×6): qty 1

## 2016-01-16 MED ORDER — ONDANSETRON HCL 4 MG/2ML IJ SOLN
INTRAMUSCULAR | Status: DC | PRN
  Administered 2016-01-16: 4 mg via INTRAVENOUS

## 2016-01-16 MED ORDER — LIDOCAINE HCL (CARDIAC) 20 MG/ML IV SOLN
INTRAVENOUS | Status: DC | PRN
  Administered 2016-01-16: 40 mg via INTRAVENOUS

## 2016-01-16 MED ORDER — SCOPOLAMINE 1 MG/3DAYS TD PT72
MEDICATED_PATCH | TRANSDERMAL | Status: AC
  Filled 2016-01-16: qty 1

## 2016-01-16 MED ORDER — ZOLPIDEM TARTRATE 5 MG PO TABS: 5.0000 mg | ORAL_TABLET | Freq: Every evening | ORAL | Status: DC | PRN

## 2016-01-16 MED ORDER — ONDANSETRON HCL 4 MG/2ML IJ SOLN
INTRAMUSCULAR | Status: AC
  Filled 2016-01-16: qty 2

## 2016-01-16 MED ORDER — CEFAZOLIN SODIUM-DEXTROSE 2-3 GM-% IV SOLR
INTRAVENOUS | Status: DC | PRN
  Administered 2016-01-16: 2 g via INTRAVENOUS

## 2016-01-16 MED ORDER — METHYLERGONOVINE MALEATE 0.2 MG/ML IJ SOLN
INTRAMUSCULAR | Status: AC
  Filled 2016-01-16: qty 1

## 2016-01-16 MED ORDER — MENTHOL 3 MG MT LOZG: 1.0000 | LOZENGE | OROMUCOSAL | Status: DC | PRN

## 2016-01-16 MED ORDER — OXYTOCIN 40 UNITS IN LACTATED RINGERS INFUSION - SIMPLE MED
2.5000 [IU]/h | INTRAVENOUS | Status: AC
  Administered 2016-01-16: 2.5 [IU]/h via INTRAVENOUS

## 2016-01-16 MED ORDER — ACETAMINOPHEN 325 MG PO TABS: 650.0000 mg | ORAL_TABLET | Freq: Once | ORAL | Status: DC

## 2016-01-16 MED ORDER — LACTATED RINGERS IV SOLN: INTRAVENOUS | Status: DC

## 2016-01-16 MED ORDER — MEPERIDINE HCL 25 MG/ML IJ SOLN: 6.2500 mg | INTRAMUSCULAR | Status: DC | PRN

## 2016-01-16 MED ORDER — DIPHENHYDRAMINE HCL 25 MG PO CAPS: 25.0000 mg | ORAL_CAPSULE | ORAL | Status: DC | PRN

## 2016-01-16 MED ORDER — MIDAZOLAM HCL 2 MG/2ML IJ SOLN
INTRAMUSCULAR | Status: AC
  Filled 2016-01-16: qty 2

## 2016-01-16 MED ORDER — OXYTOCIN 10 UNIT/ML IJ SOLN
INTRAMUSCULAR | Status: AC
  Filled 2016-01-16: qty 4

## 2016-01-16 MED ORDER — WITCH HAZEL-GLYCERIN EX PADS: 1.0000 "application " | MEDICATED_PAD | CUTANEOUS | Status: DC | PRN

## 2016-01-16 MED ORDER — PRENATAL MULTIVITAMIN CH
1.0000 | ORAL_TABLET | Freq: Every day | ORAL | Status: DC
  Filled 2016-01-16: qty 1

## 2016-01-16 MED ORDER — BETAMETHASONE SOD PHOS & ACET 6 (3-3) MG/ML IJ SUSP: 12.0000 mg | Freq: Once | INTRAMUSCULAR | Status: DC

## 2016-01-16 MED ORDER — SUCCINYLCHOLINE CHLORIDE 20 MG/ML IJ SOLN
INTRAMUSCULAR | Status: DC | PRN
  Administered 2016-01-16: 120 mg via INTRAVENOUS

## 2016-01-16 MED ORDER — NALBUPHINE HCL 10 MG/ML IJ SOLN: 5.0000 mg | Freq: Once | INTRAMUSCULAR | Status: DC | PRN

## 2016-01-16 MED ORDER — PHENYLEPHRINE 40 MCG/ML (10ML) SYRINGE FOR IV PUSH (FOR BLOOD PRESSURE SUPPORT)
PREFILLED_SYRINGE | INTRAVENOUS | Status: AC
  Filled 2016-01-16: qty 10

## 2016-01-16 MED ORDER — ONDANSETRON HCL 4 MG/2ML IJ SOLN: 4.0000 mg | Freq: Three times a day (TID) | INTRAMUSCULAR | Status: DC | PRN

## 2016-01-16 MED ORDER — GLYCOPYRROLATE 0.2 MG/ML IJ SOLN
INTRAMUSCULAR | Status: AC
  Filled 2016-01-16: qty 1

## 2016-01-16 MED ORDER — SODIUM CHLORIDE 0.9 % IV SOLN: Freq: Once | INTRAVENOUS | Status: DC

## 2016-01-16 MED ORDER — TETANUS-DIPHTH-ACELL PERTUSSIS 5-2.5-18.5 LF-MCG/0.5 IM SUSP: 0.5000 mL | Freq: Once | INTRAMUSCULAR | Status: DC

## 2016-01-16 MED ORDER — NALOXONE HCL 2 MG/2ML IJ SOSY: 1.0000 ug/kg/h | PREFILLED_SYRINGE | INTRAVENOUS | Status: DC | PRN

## 2016-01-16 MED ORDER — SODIUM CHLORIDE 0.9 % IR SOLN
Status: DC | PRN
  Administered 2016-01-16: 1000 mL

## 2016-01-16 MED ORDER — CEFAZOLIN SODIUM-DEXTROSE 2-4 GM/100ML-% IV SOLN
INTRAVENOUS | Status: AC
  Filled 2016-01-16: qty 100

## 2016-01-16 MED ORDER — FENTANYL CITRATE (PF) 100 MCG/2ML IJ SOLN
INTRAMUSCULAR | Status: DC | PRN
  Administered 2016-01-16: 250 ug via INTRAVENOUS

## 2016-01-16 MED ORDER — DIBUCAINE 1 % RE OINT: 1.0000 "application " | TOPICAL_OINTMENT | RECTAL | Status: DC | PRN

## 2016-01-16 MED ORDER — SIMETHICONE 80 MG PO CHEW
80.0000 mg | CHEWABLE_TABLET | ORAL | Status: DC
  Administered 2016-01-16 – 2016-01-18 (×2): 80 mg via ORAL
  Filled 2016-01-16 (×2): qty 1

## 2016-01-16 MED ORDER — GLYCOPYRROLATE 0.2 MG/ML IJ SOLN
INTRAMUSCULAR | Status: DC | PRN
  Administered 2016-01-16: .4 mg via INTRAVENOUS

## 2016-01-16 MED ORDER — DIPHENHYDRAMINE HCL 25 MG PO CAPS: 25.0000 mg | ORAL_CAPSULE | Freq: Four times a day (QID) | ORAL | Status: DC | PRN

## 2016-01-16 MED ORDER — ACETAMINOPHEN 10 MG/ML IV SOLN
1000.0000 mg | Freq: Once | INTRAVENOUS | Status: AC
  Administered 2016-01-16: 1000 mg via INTRAVENOUS
  Filled 2016-01-16: qty 100

## 2016-01-16 MED ORDER — OXYTOCIN 10 UNIT/ML IJ SOLN
INTRAVENOUS | Status: DC | PRN
  Administered 2016-01-16: 40 [IU] via INTRAVENOUS

## 2016-01-16 MED ORDER — FENTANYL CITRATE (PF) 100 MCG/2ML IJ SOLN
INTRAMUSCULAR | Status: AC
  Filled 2016-01-16: qty 2

## 2016-01-16 MED ORDER — SCOPOLAMINE 1 MG/3DAYS TD PT72
1.0000 | MEDICATED_PATCH | Freq: Once | TRANSDERMAL | Status: DC
  Filled 2016-01-16: qty 1

## 2016-01-16 MED ORDER — LACTATED RINGERS IV SOLN
INTRAVENOUS | Status: DC
  Administered 2016-01-16 (×2): via INTRAVENOUS

## 2016-01-16 SURGICAL SUPPLY — 37 items
APL SKNCLS STERI-STRIP NONHPOA (GAUZE/BANDAGES/DRESSINGS) ×1
BENZOIN TINCTURE PRP APPL 2/3 (GAUZE/BANDAGES/DRESSINGS) ×2 IMPLANT
CHLORAPREP W/TINT 26ML (MISCELLANEOUS) ×2 IMPLANT
CLAMP CORD UMBIL (MISCELLANEOUS) IMPLANT
CLOTH BEACON ORANGE TIMEOUT ST (SAFETY) ×2 IMPLANT
DRSG OPSITE POSTOP 4X10 (GAUZE/BANDAGES/DRESSINGS) ×2 IMPLANT
ELECT REM PT RETURN 9FT ADLT (ELECTROSURGICAL) ×2
ELECTRODE REM PT RTRN 9FT ADLT (ELECTROSURGICAL) ×1 IMPLANT
EXTRACTOR VACUUM KIWI (MISCELLANEOUS) IMPLANT
GLOVE BIOGEL PI IND STRL 6.5 (GLOVE) ×1 IMPLANT
GLOVE BIOGEL PI IND STRL 7.0 (GLOVE) ×2 IMPLANT
GLOVE BIOGEL PI INDICATOR 6.5 (GLOVE) ×1
GLOVE BIOGEL PI INDICATOR 7.0 (GLOVE) ×3
GLOVE ECLIPSE 6.5 STRL STRAW (GLOVE) ×4 IMPLANT
GOWN STRL REUS W/TWL LRG LVL3 (GOWN DISPOSABLE) ×6 IMPLANT
KIT ABG SYR 3ML LUER SLIP (SYRINGE) ×1 IMPLANT
LIQUID BAND (GAUZE/BANDAGES/DRESSINGS) IMPLANT
NDL HYPO 25X5/8 SAFETYGLIDE (NEEDLE) IMPLANT
NEEDLE HYPO 25X5/8 SAFETYGLIDE (NEEDLE) ×2 IMPLANT
NS IRRIG 1000ML POUR BTL (IV SOLUTION) ×2 IMPLANT
PACK C SECTION WH (CUSTOM PROCEDURE TRAY) ×2 IMPLANT
PAD ABD 7.5X8 STRL (GAUZE/BANDAGES/DRESSINGS) ×2 IMPLANT
PAD OB MATERNITY 4.3X12.25 (PERSONAL CARE ITEMS) ×2 IMPLANT
PENCIL SMOKE EVAC W/HOLSTER (ELECTROSURGICAL) ×2 IMPLANT
RTRCTR C-SECT PINK 25CM LRG (MISCELLANEOUS) ×2 IMPLANT
STRIP CLOSURE SKIN 1/2X4 (GAUZE/BANDAGES/DRESSINGS) ×2 IMPLANT
SUT PLAIN 0 NONE (SUTURE) ×1 IMPLANT
SUT PLAIN 2 0 XLH (SUTURE) IMPLANT
SUT VIC AB 0 CT1 27 (SUTURE) ×4
SUT VIC AB 0 CT1 27XBRD ANBCTR (SUTURE) ×2 IMPLANT
SUT VIC AB 0 CTX 36 (SUTURE) ×6
SUT VIC AB 0 CTX36XBRD ANBCTRL (SUTURE) ×3 IMPLANT
SUT VIC AB 2-0 CT1 27 (SUTURE) ×2
SUT VIC AB 2-0 CT1 TAPERPNT 27 (SUTURE) ×1 IMPLANT
SUT VIC AB 4-0 KS 27 (SUTURE) ×2 IMPLANT
TOWEL OR 17X24 6PK STRL BLUE (TOWEL DISPOSABLE) ×2 IMPLANT
TRAY FOLEY CATH SILVER 14FR (SET/KITS/TRAYS/PACK) ×1 IMPLANT

## 2016-01-16 NOTE — Anesthesia Procedure Notes (Signed)
Procedure Name: Intubation Date/Time: 01/16/2016 5:10 AM Performed by: Shona SimpsonHOLLIS, Myonna Chisom D Pre-anesthesia Checklist: Patient identified, Emergency Drugs available, Suction available, Patient being monitored and Timeout performed Patient Re-evaluated:Patient Re-evaluated prior to inductionOxygen Delivery Method: Circle system utilized Preoxygenation: Pre-oxygenation with 100% oxygen Intubation Type: IV induction, Rapid sequence and Cricoid Pressure applied Laryngoscope Size: Glidescope and 3 Grade View: Grade I Tube type: Oral Tube size: 7.0 mm Number of attempts: 1 Placement Confirmation: ETT inserted through vocal cords under direct vision and positive ETCO2 Tube secured with: Tape

## 2016-01-16 NOTE — Anesthesia Preprocedure Evaluation (Signed)
Anesthesia Evaluation  Patient identified by MRN, date of birth, ID band Patient awake  Preop documentation limited or incomplete due to emergent nature of procedure.  Airway Mallampati: I  TM Distance: >3 FB Neck ROM: Full    Dental  (+) Teeth Intact   Pulmonary Current Smoker,    breath sounds clear to auscultation- rhonchi       Cardiovascular negative cardio ROS   Rhythm:Regular Rate:Normal     Neuro/Psych negative neurological ROS  negative psych ROS   GI/Hepatic   Endo/Other    Renal/GU   negative genitourinary   Musculoskeletal   Abdominal   Peds negative pediatric ROS (+)  Hematology   Anesthesia Other Findings   Reproductive/Obstetrics (+) Pregnancy                             Anesthesia Physical Anesthesia Plan  ASA: II and emergent  Anesthesia Plan: General   Post-op Pain Management:    Induction: Rapid sequence, Cricoid pressure planned and Intravenous  Airway Management Planned: Oral ETT and Video Laryngoscope Planned  Additional Equipment:   Intra-op Plan:   Post-operative Plan: Extubation in OR  Informed Consent: I have reviewed the patients History and Physical, chart, labs and discussed the procedure including the risks, benefits and alternatives for the proposed anesthesia with the patient or authorized representative who has indicated his/her understanding and acceptance.   Dental advisory given  Plan Discussed with: CRNA  Anesthesia Plan Comments: (Emergent Placental Abruption, Baby HR 70's)        Anesthesia Quick Evaluation

## 2016-01-16 NOTE — Lactation Note (Signed)
This note was copied from a baby's chart. Lactation Consultation Note  Patient Name: Alexandria Gutierrez Today's Date: 01/16/2016 Reason for consult: Initial assessment;NICU baby Breastfeeding consultation services and Providing Breastmilk For Your Baby in NICU given and reviewed.  Baby is 4 hours old/30.[redacted] weeks gestation.  Mom had emergency C/S due to abruption.  Mom's goal is to pump and bottle feed baby.  Symphony pump set up and initiated.  Instructed mom to pump every 2-3 hours x 15 minutes followed by hand expression.  Mom has a history of abundant milk supply.  She has a Pump In Style at home but [facility]  [place] [facility] [facility]  Encouraged to call with concerns/assist prn.  Maternal Data Has patient been taught Hand Expression?: Yes Does the patient have breastfeeding experience prior to this delivery?: Yes  Feeding    LATCH Score/Interventions                      Lactation Tools Discussed/Used WIC Program: Yes Pump Review: Setup, frequency, and cleaning;Milk Storage Initiated by:: LC Date initiated:: 01/16/16   Consult Status Consult Status: Follow-up Date: 01/17/16 Follow-up type: In-patient    Huston FoleyMOULDEN, Cheyenne Schumm S 01/16/2016, 9:24 AM

## 2016-01-16 NOTE — Anesthesia Postprocedure Evaluation (Signed)
Anesthesia Post Note  Patient: Alexandria Gutierrez  Procedure(s) Performed: Procedure(s) (LRB): CESAREAN SECTION (N/A)  Patient location during evaluation: Women's Unit Anesthesia Type: General Level of consciousness: awake and alert Pain management: pain level controlled Vital Signs Assessment: post-procedure vital signs reviewed and stable Respiratory status: spontaneous breathing Cardiovascular status: blood pressure returned to baseline Postop Assessment: no headache and no signs of nausea or vomiting     Last Vitals:  Vitals:   01/16/16 0845 01/16/16 1202  BP: 109/86 117/61  Pulse: 77 77  Resp: 16 16  Temp: 36.4 C 36.8 C    Last Pain:  Vitals:   01/16/16 0745  PainSc: 1    Pain Goal:                 [facility] Hospital[facility]LE,Alexandria Gutierrez

## 2016-01-16 NOTE — Anesthesia Postprocedure Evaluation (Signed)
Anesthesia Post Note  Patient: Alexandria Gutierrez  Procedure(s) Performed: Procedure(s) (LRB): CESAREAN SECTION (N/A)  Patient location during evaluation: PACU Anesthesia Type: General Level of consciousness: awake and alert Pain management: pain level controlled Vital Signs Assessment: post-procedure vital signs reviewed and stable Respiratory status: spontaneous breathing, nonlabored ventilation, respiratory function stable and patient connected to nasal cannula oxygen Cardiovascular status: blood pressure returned to baseline and stable Postop Assessment: no signs of nausea or vomiting Anesthetic complications: no     Last Vitals:  Vitals:   01/16/16 0645 01/16/16 0700  BP: 125/73 114/76  Pulse: 76 88  Resp: 11 16  Temp:      Last Pain:  Vitals:   01/16/16 0700  PainSc: 6    Pain Goal:                 Shelton SilvasKevin D Allona Gondek

## 2016-01-16 NOTE — MAU Provider Note (Signed)
  History     CSN: [id number]  Arrival date and time: 01/16/16 0327   None     Chief Complaint  Patient [id number] with  . Vaginal Bleeding   Alexandria Gutierrez is 35 y.o. G2P2002 at 31 weeks who presents today with vaginal bleeding. She states that she had a gush of blood about 20 min PTA. She states that she has not had any since. She denies any pain. She denies any LOF. She reports normal fetal movement. She denies any complications with this pregnancy .    Past Medical History:  Diagnosis Date  . Abnormal finding on Pap smear 01/2011   HSIL  . HSIL (high grade squamous intraepithelial lesion) on Pap smear 04/12/2011  . Postpartum care following vaginal delivery (08/04/13) 08/04/2013  . SVD (spontaneous vaginal delivery) 08/04/2013    Past Surgical History:  Procedure Laterality Date  . CYSTECTOMY  89 or 35 years old   on wrist  . WISDOM TOOTH EXTRACTION      Family History  Problem Relation Age of Onset  . Alcohol abuse Maternal Aunt     alcohol  . Mental illness Maternal Aunt     Social History  Substance Use Topics  . Smoking status: Current Every Day Smoker    Packs/day: 0.50    Types: Cigarettes  . Smokeless tobacco: Never Used  . Alcohol use 0.0 oz/week    1 - 3 Glasses of wine per week    Allergies:  Allergies  Allergen Reactions  . Erythromycin Nausea And Vomiting    Spikes high fever to 103    Prescriptions Prior to Admission  Medication Sig Dispense Refill Last Dose  . calcium carbonate (TUMS - DOSED IN MG ELEMENTAL CALCIUM) 500 MG chewable tablet Chew 3 tablets by mouth daily.   08/03/2013 at Unknown time  . ibuprofen (ADVIL,MOTRIN) 600 MG tablet Take 1 tablet (600 mg total) by mouth every 6 (six) hours. 30 tablet 0   . Melatonin 5 MG TABS Take 5 mg by mouth. Three tabs daily 15 mg     . meloxicam (MOBIC) 15 MG tablet Take 15 mg by mouth daily.     . Prenatal Vit-Fe Fumarate-FA (PRENATAL MULTIVITAMIN) TABS tablet Take 1 tablet by mouth daily at 12 noon.    Past Week at Unknown time    ROS Physical Exam   unknown if currently breastfeeding.  Physical Exam  Nursing note and vitals reviewed. Constitutional: She is oriented to person, place, and time. She appears well-developed and well-nourished. No distress.  HENT:  Head: Normocephalic.  Cardiovascular: Normal rate.   Respiratory: Effort normal.  GI: Soft. There is no tenderness. There is no rebound.  Genitourinary:  Genitourinary Comments: Cervix: closed/thick/-1 +bloody show.   Neurological: She is alert and oriented to person, place, and time.  Skin: Skin is warm and dry.  Psychiatric: She has a normal mood and affect.  FHT: 100 Toco no UCs IV started, 02 applied. Patient on left side.  0345: FHT 120  MAU Course  Procedures  MDM 0344: D/W Dr. Charlotta Newtonzan. She is on her way in. Nigel BridgemanVicki Latham updated on patient status.  0345Nigel Bridgeman: Vicki Latham on the unit, and in to see the patient. Assumes care at this time.   Assessment and Plan  Vaginal bleeding in pregnancy, 3rd trimester  Care turned over to [facility]/Vicki JohnstonvilleLatham, CNM  Tawnya CrookHogan, Heather Donovan 01/16/2016, 3:45 AM

## 2016-01-16 NOTE — MAU Note (Signed)
Pt reports she got up this am and had a gush of bright red blood, denies pain. No active bleeding upon admission, pt taken directly to room and upon placing EFM fetal heart rate noted to be in 80's. Help called to bedside, Mathews RobinsonsHogan CNM called to bedside and IV started , O2 at 10L applied, pt turned from side to side. And fetal movement audible. SVE closed.

## 2016-01-16 NOTE — Progress Notes (Signed)
Initial visit with Alexandria Gutierrez to introduce spiritual care services and offer support upon the birth of her daughter, Alexandria Gutierrez.  She shared that she has two other children, 14 and 2, but this is her first time with NICU.  She had an emergency C-section which was frightening, but she is grateful that her baby is okay.  She shared that today they plan to remove her breathing tube and she is eager to hold her.  SHe was appreciative of support.  Please page as further needs arise.  [name], M.Div. [facility] Chaplain Pager [phone] Office [phone]

## 2016-01-16 NOTE — Op Note (Addendum)
PreOp Diagnosis: 1) Nonreassuring fetal heart tones remote from delivery 2) Placental abruption PostOp Diagnosis: same Procedure: Primary LTCS Surgeon: Dr. Myna HidalgoJennifer Nekeya Gutierrez Assistant: Nigel BridgemanVicki Latham, CNM Anesthesia: general Complications: none EBL: 500cc UOP: 100cc Fluids: 1700cc  Findings: Female infant from vertex presentation with significant abruption noted.  Normal uterus, tubes and ovaries bilaterally.  Uterine atony noted, IV Methergine and IM Hemabate was given.  PROCEDURE:  Informed consent was obtained from the patient with risks, benefits, complications, treatment options, and expected outcomes discussed with the patient.  The patient concurred with the proposed plan, giving informed consent with verbal consent.  The patient was taken to Operating Room, and identified with the procedure verified as C-Section Delivery with Time Out.  The patient was prepped and draped in the usual sterile fashion.  With induction of anesthesia, a Pfannenstiel incision was made and carried down through the subcutaneous tissue to the fascia. The fascia was incised in the midline and extended transversely. The rectus muscle was separated and blunt dissection was performed down to the uterus.  A low transverse uterine incision was made, abruption visualized and placenta delivered with the infant.  The infant's head delivered atraumatically. After the umbilical cord was clamped and cut, the infant was handed off to awaiting pediatrician. The uterine outline, tubes and ovaries appeared normal. The uterine incision was closed with running locked sutures of 0 Vicryl and a second layer of the same stitch was used in an imbricating fashion.  Excellent hemostasis was obtained.  The pericolic gutters were then cleared of all clots and debris.  The fascia was then reapproximated with running sutures of 0 Vicryl. The skin was closed with 4-0 vicryl in a subcuticular fashion.  Instrument, sponge, and needle counts were correct  prior the abdominal closure and at the conclusion of the case. The patient was taken to recovery in stable condition.  Myna HidalgoJennifer Deyani Hegarty, DO [phone] (pager) [phone] (office)

## 2016-01-16 NOTE — Progress Notes (Signed)
Late entry:  Pt was taken for a primary C-section due to Non-reassuring fetal heart tones/fetal bradycardia due to placental abruption.  Risk benefits and alternatives of cesarean section were discussed with the patient including but not limited to infection, bleeding, damage to bowel , bladder and baby with the need for further surgery. Pt voiced understanding and desires to proceed- verbal consent was obtained.  Myna HidalgoJennifer Joriel Streety, DO [phone] (pager) [phone] (office)

## 2016-01-16 NOTE — Transfer of Care (Signed)
Immediate Anesthesia Transfer of Care Note  Patient: Alexandria Gutierrez  Procedure(s) Performed: Procedure(s): CESAREAN SECTION (N/A)  Patient Location: PACU  Anesthesia Type:General  Level of Consciousness: awake, oriented and sedated  Airway & Oxygen Therapy: Patient Spontanous Breathing and Patient connected to nasal cannula oxygen  Post-op Assessment: Report given to RN and Post -op Vital signs reviewed and stable  Post vital signs: Reviewed and stable  Last Vitals:  Vitals:   01/16/16 0602  BP: (P) 132/88  Resp: (P) 15  Temp: (P) 36.7 C    Last Pain: There were no vitals filed for this visit.       Complications: No apparent anesthesia complications

## 2016-01-16 NOTE — MAU Note (Signed)
Bedside u/s

## 2016-01-16 NOTE — H&P (Signed)
Alexandria Gutierrez is a 35 y.o. female, G3P2002 at 3930 5/7 weeks, presenting for episode of painless bright red bleeding at home, now resolved.  Patient denies recent IC, no trauma, no leaking, denies contractions, or any other issue.    She is Rh negative, received Rhophylac 01/13/16.  Patient Active Problem List   Diagnosis Date Noted  . Antepartum placental abruption 01/16/2016  . Rh negative state in antepartum period 01/16/2016  . Status post primary low transverse cesarean section--complete abruption 01/16/2016  . Smoker 01/16/2016  . CIN II (cervical intraepithelial neoplasia II) 04/12/2011  Hx LEEP procedure in past Hx positive PPD in past  History of present pregnancy: Patient entered care at 8 6/7 weeks, in transfer [facility].   EDC of 03/21/16 was established by LMP.   Anatomy scan:  20 weeks, with normal findings and an anterior placenta.   Additional US evaluations:  None  Significant prenatal events:  Smoker, approx 5/day. Last evaluation:  01/13/16, WNL, received Rhophylac  OB History    Gravida Para Term Preterm AB Living   3 2 2  0 0 2   SAB TAB Ectopic Multiple Live Births   0 0 0 0 1    2003[place]SV[place] term, 6+, [place]--SVB, term, 6+, [facility]  Past Medical History:  Diagnosis Date  . Abnormal finding on Pap smear 01/2011   HSIL  . HSIL (high grade squamous intraepithelial lesion) on Pap smear 04/12/2011  . Postpartum care following vaginal delivery (08/04/13) 08/04/2013  . SVD (spontaneous vaginal delivery) 08/04/2013   Past Surgical History:  Procedure Laterality Date  . CYSTECTOMY  89 or 35 years old   on wrist  . WISDOM TOOTH EXTRACTION     Family History: family history includes Alcohol abuse in her maternal aunt; Mental illness in her maternal aunt.   Social History:  reports that she has been smoking Cigarettes.  She has been smoking about 0.50 packs per day. She has never used smokeless tobacco. She reports  that she drinks alcohol. She reports that she does not use drugs.  She is Caucasian, 2 years of college, married to Casimiro NeedleMichael, who is involved and supportive.   Prenatal Transfer Tool  Maternal Diabetes: No Genetic Screening: Normal 1st trimester screen Maternal Ultrasounds/Referrals: Normal during pregnancy, current US shows abruption Fetal Ultrasounds or other Referrals:  None Maternal Substance Abuse:  Yes:  Type: Smoker Significant Maternal Medications:  None Significant Maternal Lab Results: RH negative, no GBS testing yet  TDAP 12/31/15 Flu NA  ROS:  Vaginal bleeding prior to arrival, +FM  Allergies  Allergen Reactions  . Erythromycin Nausea And Vomiting    Spikes high fever to 103     Dilation: Closed Exam by:: Hogan,CNM Blood pressure 132/88, temperature 98 F (36.7 C), resp. rate 15, unknown if currently breastfeeding.  Gen: no acute distress, non-diaphoretic Skin: warm & dry, no rashes Neck: supple, normal appearance Chest clear Heart RRR without murmur Abd gravid, NT, no rebound, no guarding, FH 128 bpm Pelvic: closed, thick, per Thressa ShellerHeather Hogan, CNM Ext: No edema, no calf tenderness bilaterally Neuro: alert & oriented x 3 Psych: mood appropriate for given situation  FHR: Category 2 at times, other cycles of reassuring per EGA UCs:  None  Bedside US:  Vtx, anterior placenta, AFI 9.34, 9%ile, ABRUPTION NOTED BEHIND PLACENTA ON EDGE CLOSEST TO CERVIX, 8.9 X 4.1 X 7.3 CM.  BPP 8/8.  EFW 2+2, < 10%ILE, INTERMITTENT AEDF/RDFV.  Prenatal labs: ABO, Rh: --/--/  A NEG (08/25 0327)A neg Antibody: POS (08/25 0327)Neg Rubella:  Immune RPR: Nonreactive (03/09 0000) NR HBsAg: Negative (03/09 0000) Neg HIV: Non-reactive (03/09 0000) NR GBS:  NA Sickle cell/Hgb electrophoresis:  NA Pap:  07/2015--ASCUS GC:  Negative 08/14/15 Chlamydia:  Negative 08/14/15 Genetic screenings:  Normal 1st trimester screen Glucola:  WNL Other:   Hgb 13.8 at NOB, 13.4 at 28  weeks    Assessment/Plan: IUP at 30 5/7 weeks Abruption Rh negative Smoker IUGR/abnormal dopplers  Plan: Admit to Birthing Suite per consult with Dr. Lavera Guise consider transfer to Antenatal if stabilizes. IV fluids CBC, CMP, LDH, uric acid Kleihauer-Betke T&X for 2 units to be held Betamethasone course  Dr. Charlotta Newton reviewed plan of care with patient and husband, including need for close observation of maternal/fetal status, possible need for emergent C/S, blood transfusion, or other complications.   MFM consult in am for plan of care.  Nigel Bridgeman CNM, [place] 01/16/2016, 0420    Pt seen and examined- based on US findings concern for placental abruption.  Discussed plan of conservative therapy with close observation, IV betamethasone and fluids.  Ideally, would like to avoid delivery if both mom and baby are stable.  Discussed with patient the other extreme- should there be concern of non-reassuring fetal or maternal status would plan to proceed with C-section.  Discussed risk/benefit and emergent nature of procedure should be there concern regarding fetal well being.  Questions and concerns were addressed.  Pt voiced understanding and plan made to proceed to L&D for close observation.  Myna Hidalgo, DO [phone] (pager) [phone] (office)

## 2016-01-16 NOTE — Progress Notes (Signed)
Stopped By NICU prior to transfer to room 306.  Report given to Thayer Ohmhris, [place].

## 2016-01-17 LAB — CBC
HCT: 28.8 % — ABNORMAL LOW (ref 36.0–46.0)
Hemoglobin: 10.2 g/dL — ABNORMAL LOW (ref 12.0–15.0)
MCH: 31.4 pg (ref 26.0–34.0)
MCHC: 35.4 g/dL (ref 30.0–36.0)
MCV: 88.6 fL (ref 78.0–100.0)
PLATELETS: 160 10*3/uL (ref 150–400)
RBC: 3.25 MIL/uL — AB (ref 3.87–5.11)
RDW: 13.6 % (ref 11.5–15.5)
WBC: 12.7 10*3/uL — ABNORMAL HIGH (ref 4.0–10.5)

## 2016-01-17 LAB — RPR: RPR: NONREACTIVE

## 2016-01-17 NOTE — Progress Notes (Signed)
Subjective: Postpartum Day 1: Cesarean Delivery Patient reports tolerating PO, + flatus and no problems voiding.    Objective: Vital signs in last 24 hours: Temp:  [98.2 F (36.8 C)-99.2 F (37.3 C)] 98.7 F (37.1 C) (08/26 0600) Pulse Rate:  [61-86] 62 (08/26 0600) Resp:  [16-18] 18 (08/26 0600) BP: (101-117)/(53-61) 101/60 (08/26 0600) SpO2:  [96 %-99 %] 96 % (08/26 0600)  Physical Exam:  General: alert and no distress Lochia: appropriate Uterine Fundus: firm Incision: dressing c/d/i DVT Evaluation: No evidence of DVT seen on physical exam.   Recent Labs  01/16/16 0327 01/17/16 0524  HGB 13.4 10.2*  HCT 37.9 28.8*    Assessment/Plan: Status post Cesarean section. Doing well postoperatively.  Continue current care. Baby doing well in the NICU  Kacy Conely Y 01/17/2016, 11:05 AM

## 2016-01-17 NOTE — Lactation Note (Signed)
This note was copied from a baby's chart. Lactation Consultation Note  Patient Name: Girl Horatio PelMeagen Gaona BJYNW'GToday's Date: 01/17/2016 Reason for consult: Follow-up assessment;NICU baby Infant is 331 hours old & [facility]. Mom stated she is pumping every 2-3 hrs on initiate setting except she went a 5hr stretch last night & then got ~2 oz this morning. Mom stated she exclusively pumped with her last baby due to overactive letdown & had an abundant supply. Mom stated she has a personal pump from her first baby but also [facility] Encouraged mom to continue pumping 8-12x/ 24hrs and to try latching when the NICU staff allow her. Mom reports no questions at this time. Encouraged mom to call if she has any later.  Maternal Data    Feeding    LATCH Score/Interventions                      Lactation Tools Discussed/Used WIC Program: Yes   Consult Status Consult Status: Follow-up Date: 01/18/16 Follow-up type: In-patient    Oneal GroutLaura C Perkins Molina 01/17/2016, 12:24 PM

## 2016-01-17 NOTE — Plan of Care (Signed)
Problem: Skin Integrity: Goal: Risk for impaired skin integrity will decrease Outcome: Completed/Met Date Met: 01/17/16 Patient is ambulatory and walks in the halls frequently.  Problem: Activity: Goal: Risk for activity intolerance will decrease Outcome: Completed/Met Date Met: 01/17/16 Tolerates activity well and walks frequently to NICU.  Problem: Nutritional: Goal: Dietary intake will improve Outcome: Completed/Met Date Met: 01/17/16 Tolerating a regular diet well. Goal: Mother's verbalization of comfort with breastfeeding process will improve Outcome: Not Applicable Date Met: 55/97/41 Patient is pumping and hand expressing for infant in NICU.

## 2016-01-18 MED ORDER — OXYCODONE HCL 5 MG PO TABS: 5.0000 mg | ORAL_TABLET | Freq: Four times a day (QID) | ORAL | 0 refills | Status: DC | PRN

## 2016-01-18 MED ORDER — IBUPROFEN 600 MG PO TABS: 600.0000 mg | ORAL_TABLET | Freq: Four times a day (QID) | ORAL | 0 refills | Status: DC

## 2016-01-18 NOTE — Lactation Note (Signed)
This note was copied from a baby's chart. Lactation Consultation Note  Patient Name: Alexandria Gutierrez Today's Date: 01/18/2016   Visited with Mom on day of early discharge, baby in NICU at 2654 hrs old.  Mom has been pumping regularly and getting 4 oz, which she is taking to the NICU.  Mom aware of pump rooms in the NICU, and told to take her pump parts home with her to transport to NICU with her.  Breasts are filling, not engorged.  Engorgement prevention and treatment discussed.  [facility]  Has a Medela PIS at home, and asked for Harmony manual pump to use also.  Encouraged >8 pumpings in 24 hrs. Reminded Mom of OP lactation services available to her and encouraged her to call for assistance as needed.     Judee ClaraSmith, Kionte Baumgardner E 01/18/2016, 11:48 AM

## 2016-01-18 NOTE — Progress Notes (Signed)
Discharge teaching complete. Pt understood all information and did not have any questions. Pt ambulated out of the hospital and discharged home to family.  

## 2016-01-18 NOTE — Discharge Summary (Signed)
Cesarean Section Delivery Discharge Summary  Alexandria Gutierrez  DOB:    01/25/1981 MRN:    [id number] CSN:    [id number]  Date of admission:                  01/16/2016   Date of discharge:                   01/18/2016  Procedures this admission: Emergency Cesarean delivery  Date of Delivery: 01/16/2016  Newborn Data:  Live born female, in NICU Birth Weight: 2 lb 1.5 oz (950 g) APGAR: 0, 7  History of Present Illness:  Ms. Alexandria Gutierrez is a 35 y.o. female, G3P2103, who presents at 3719w5d weeks gestation. [facility]   Her pregnancy has been complicated by:  Patient Active Problem List   Diagnosis Date Noted  . Antepartum placental abruption 01/16/2016  . Rh negative state in antepartum period 01/16/2016  . Status post primary low transverse cesarean section--complete abruption 01/16/2016  . Smoker 01/16/2016  . CIN II (cervical intraepithelial neoplasia II) 04/12/2011    Hospital Course--Scheduled Cesarean: Patient was admitted on 01/16/16 for a vaginal bleeding.  Placenta abruption was found on ultrasound.  An emergency Cesarean delivery was then performed for fetal bradycardia by Dr. Charlotta Newtonzan.    The patient was taken to recovery in good condition.  Patient planned to breast and bottle feed.  On post-op day 1, patient was doing well, tolerating a regular diet. Throughout her stay, her physical exam was WNL, her incision was CDI, and her vital signs remained stable.  By post-op day 2, she was up ad lib, tolerating a regular diet, with good pain control with po med.  She was deemed to have received the full benefit of her hospital stay, and was discharged home in stable condition as per her request for early discharge citing need to care of her 35 year old at home.  Contraceptive choice was IUD.  She agreed to continue using nicotine patches for smoking history upon discharge.    Hemoglobin Results:  CBC Latest Ref Rng & Units 01/17/2016 01/16/2016 08/03/2013  WBC 4.0 - 10.5 K/uL 12.7(H) 12.5(H) 12.3(H)  Hemoglobin 12.0 - 15.0 g/dL 10.2(L) 13.4 11.9(L)  Hematocrit 36.0 - 46.0 % 28.8(L) 37.9 34.8(L)  Platelets 150 - 400 K/uL 160 194 209   Prenatal labs: ABO, Rh: --/--/A NEG (08/25 0327)A neg Antibody: POS (08/25 0327)Neg Rubella:  Immune RPR: Nonreactive (03/09 0000) NR HBsAg: Negative (03/09 0000) Neg HIV: Non-reactive (03/09 0000) NR GBS:  NA Sickle cell/Hgb electrophoresis:  NA Pap:  07/2015--ASCUS GC:  Negative 08/14/15 Chlamydia:  Negative 08/14/15 Genetic screenings:  Normal 1st trimester screen Glucola:  WNL   Discharge Physical Exam:   General: alert, cooperative and no distress Lochia: appropriate Uterine Fundus: firm Abdomen:  + bowel sounds Incision: no significant drainage, no significant erythema DVT Evaluation: No evidence of DVT seen on physical exam.  Intrapartum Procedures: cesarean: low cervical, transverse Postpartum Procedures: none Complications-Operative and Postpartum: none  Discharge Diagnoses: Antepartum bleeding and Placenta bruption, Preterm pregnancy delivered. Smoker.   Discharge Information:  Activity:           pelvic rest and No heavy lifting Diet:                routine Medications:  PNV, ibuprofen, oxycodone Condition:      stable Instructions:  Discharge to: home  Follow-up Information  [facility]. Call in 5 week(s).   Specialty:  Obstetrics and Gynecology Why:  Post partum check.  Contact information: [address] [place] [address] [phone]           Konrad Felix MD 01/23/2016 9:04 AM

## 2016-01-20 LAB — TYPE AND SCREEN
ABO/RH(D): A NEG
Antibody Screen: POSITIVE
DAT, IGG: NEGATIVE
UNIT DIVISION: 0
UNIT DIVISION: 0
Unit division: 0
Unit division: 0

## 2016-01-21 ENCOUNTER — Encounter (HOSPITAL_COMMUNITY): Payer: Self-pay | Admitting: Obstetrics & Gynecology

## 2016-02-05 ENCOUNTER — Encounter (HOSPITAL_COMMUNITY): Payer: Self-pay

## 2016-02-05 ENCOUNTER — Emergency Department (HOSPITAL_COMMUNITY)
Admission: EM | Admit: 2016-02-05 | Discharge: 2016-02-05 | Disposition: A | Discharge status: Home / Self Care | Payer: Medicaid Other | Attending: Emergency Medicine | Admitting: Emergency Medicine

## 2016-02-05 DIAGNOSIS — J020 Streptococcal pharyngitis: Secondary | ICD-10-CM | POA: Diagnosis not present

## 2016-02-05 DIAGNOSIS — F17210 Nicotine dependence, cigarettes, uncomplicated: Secondary | ICD-10-CM | POA: Diagnosis not present

## 2016-02-05 DIAGNOSIS — H9201 Otalgia, right ear: Secondary | ICD-10-CM | POA: Diagnosis present

## 2016-02-05 MED ORDER — KETOROLAC TROMETHAMINE 30 MG/ML IJ SOLN
60.0000 mg | Freq: Once | INTRAMUSCULAR | Status: AC
  Administered 2016-02-05: 60 mg via INTRAMUSCULAR
  Filled 2016-02-05: qty 2

## 2016-02-05 MED ORDER — DEXAMETHASONE SODIUM PHOSPHATE 10 MG/ML IJ SOLN
10.0000 mg | Freq: Once | INTRAMUSCULAR | Status: AC
  Administered 2016-02-05: 10 mg via INTRAMUSCULAR
  Filled 2016-02-05: qty 1

## 2016-02-05 MED ORDER — GI COCKTAIL ~~LOC~~
30.0000 mL | Freq: Once | ORAL | Status: AC
  Administered 2016-02-05: 30 mL via ORAL
  Filled 2016-02-05: qty 30

## 2016-02-05 MED ORDER — HYDROCODONE-ACETAMINOPHEN 7.5-325 MG/15ML PO SOLN: 15.0000 mL | Freq: Three times a day (TID) | ORAL | 0 refills | Status: DC | PRN

## 2016-02-05 MED ORDER — IBUPROFEN 100 MG/5ML PO SUSP: 600.0000 mg | Freq: Four times a day (QID) | ORAL | 0 refills | Status: DC | PRN

## 2016-02-05 NOTE — ED Provider Notes (Signed)
[facility]-EMERGENCY DEPT Provider Note   CSN: [id number] Arrival date & time: 02/05/16  0024  By signing my name below, I, Linna Darner, attest that this documentation has been prepared under the direction and in the presence [employer], PA-C. Electronically Signed: Linna Darner, Scribe. 02/05/2016. 1:10 AM.  History   Chief Complaint Chief Complaint  Patient presents with  . Otalgia    The history is provided by the patient. No language interpreter was used.     HPI Comments: Alexandria Gutierrez is a 35 y.o. female who presents to the Emergency Department complaining of sudden onset, constant, worsening, right ear pain beginning 4 days ago. Pt notes associated sore throat, right-sided facial swelling, and pain radiation into her right jaw. She states she can barely talk due to pain. Pt endorses sore throat exacerbation with swallowing. She reports she went to Urgent Care four days ago and had a rapid strep test performed which came back negative. She was prescribed Augmentin and has taken 2 tablets thus far. She was also prescribed promethazine. She notes she has used TheraFlu since onset with no relief of her symptoms. Pt reports that she received a phone call from Urgent Care 2 days ago and was diagnosed with strep throat. She denies fever or any other associated symptoms.   Past Medical History:  Diagnosis Date  . Abnormal finding on Pap smear 01/2011   HSIL  . HSIL (high grade squamous intraepithelial lesion) on Pap smear 04/12/2011  . Postpartum care following vaginal delivery (08/04/13) 08/04/2013  . SVD (spontaneous vaginal delivery) 08/04/2013    Patient Active Problem List   Diagnosis Date Noted  . Antepartum placental abruption 01/16/2016  . Rh negative state in antepartum period 01/16/2016  . Status post primary low transverse cesarean section--complete abruption 01/16/2016  . Smoker 01/16/2016  . CIN II (cervical intraepithelial neoplasia II) 04/12/2011    Past Surgical  History:  Procedure Laterality Date  . CESAREAN SECTION N/A 01/16/2016   Procedure: CESAREAN SECTION;  Surgeon: Myna Hidalgo, DO;  Location: [facility];  Service: Obstetrics;  Laterality: N/A;  . CYSTECTOMY  57 or 35 years old   on wrist  . WISDOM TOOTH EXTRACTION      OB History    Gravida Para Term Preterm AB Living   3 3 2 1  0 3   SAB TAB Ectopic Multiple Live Births   0 0 0 0 2       Home Medications    Prior to Admission medications   Medication Sig Start Date End Date Taking? Authorizing Provider  HYDROcodone-acetaminophen (HYCET) 7.5-325 mg/15 ml solution Take 15 mLs by mouth every 8 (eight) hours as needed for moderate pain. 02/05/16   Antony Madura, PA-C  ibuprofen (CHILD IBUPROFEN) 100 MG/5ML suspension Take 30 mLs (600 mg total) by mouth every 6 (six) hours as needed. 02/05/16   Antony Madura, PA-C  Prenatal Vit-Min-FA-Fish Oil ([employer] PRENATAL GUMMY PO) Take 2 tablets by mouth daily.    Historical Provider, MD    Family History Family History  Problem Relation Age of Onset  . Alcohol abuse Maternal Aunt     alcohol  . Mental illness Maternal Aunt     Social History Social History  Substance Use Topics  . Smoking status: Current Every Day Smoker    Packs/day: 0.50    Types: Cigarettes  . Smokeless tobacco: Never Used  . Alcohol use 0.0 oz/week    1 - 3 Glasses of wine per week  Allergies   Erythromycin   Review of Systems Review of Systems A complete 10 system review of systems was obtained and all systems are negative except as noted in the HPI and PMH.    Physical Exam Updated Vital Signs BP 128/79   Pulse 89   Temp 98.3 F (36.8 C) (Oral)   Resp 20   SpO2 99%   Physical Exam  Constitutional: She is oriented to person, place, and time. She appears well-developed and well-nourished. No distress.  Nontoxic appearing and in no distress.  HENT:  Head: Normocephalic and atraumatic.  Right Ear: Tympanic membrane, external ear and ear canal  normal.  Left Ear: Tympanic membrane, external ear and ear canal normal.  Mouth/Throat: Uvula is midline. Oropharyngeal exudate (scant in posterior oropharynx) and posterior oropharyngeal erythema present.  Patient tolerating secretions without difficulty. No trismus. No tripoding. There is b/l tonsillar enlargement with erythema and scant exudates.   Eyes: Conjunctivae and EOM are normal. No scleral icterus.  Neck: Normal range of motion.  No nuchal rigidity or meningismus.  Pulmonary/Chest: Effort normal. No respiratory distress.  Musculoskeletal: Normal range of motion.  Neurological: She is alert and oriented to person, place, and time.  Skin: Skin is warm and dry. No rash noted. She is not diaphoretic. No erythema. No pallor.  Psychiatric: She has a normal mood and affect. Her behavior is normal.  Nursing note and vitals reviewed.    ED Treatments / Results  Labs (all labs ordered are listed, but only abnormal results are displayed) Labs Reviewed - No data to display  EKG  EKG Interpretation None       Radiology No results found.  Procedures Procedures (including critical care time)  DIAGNOSTIC STUDIES: Oxygen Saturation is 96% on RA, adequate by my interpretation.    COORDINATION OF CARE: 1:17 AM Discussed treatment plan with pt at bedside and pt agreed to plan.  Medications Ordered in ED Medications  dexamethasone (DECADRON) injection 10 mg (10 mg Intramuscular Given 02/05/16 0124)  ketorolac (TORADOL) 30 MG/ML injection 60 mg (60 mg Intramuscular Given 02/05/16 0124)  gi cocktail (Maalox,Lidocaine,Donnatal) (30 mLs Oral Given 02/05/16 0123)     Initial Impression / Assessment and Plan / ED Course  I have reviewed the triage vital signs and the nursing notes.  Pertinent labs & imaging results that were available during my care of the patient were reviewed by me and considered in my medical decision making (see chart for details).  Clinical Course     35 year old female presents to the emergency department for right sided otalgia. She was recently diagnosed with strep throat and has taken 2 doses of Augmentin. Patient is afebrile today and nontoxic. She has no nuchal rigidity or meningismus. Patient is tolerating secretions without difficulty. Otalgia likely secondary to referred pain from strep throat.   Pain has greatly improved following Toradol and Decadron as well as after gargling a GI cocktail. The patient has been advised to continue her antibiotic. Will add medications for supportive care and referred to ENT. Return precautions discussed and provided. Patient discharged in satisfactory condition with no unaddressed concerns.   Final Clinical Impressions(s) / ED Diagnoses   Final diagnoses:  Strep throat  Otalgia, right    New Prescriptions New Prescriptions   HYDROCODONE-ACETAMINOPHEN (HYCET) 7.5-325 MG/15 ML SOLUTION    Take 15 mLs by mouth every 8 (eight) hours as needed for moderate pain.   IBUPROFEN (CHILD IBUPROFEN) 100 MG/5ML SUSPENSION    Take 30 mLs (600 mg  total) by mouth every 6 (six) hours as needed.    I personally performed the services described in this documentation, which was scribed in my presence. The recorded information has been reviewed and is accurate.      Antony MaduraKelly Valary Manahan, PA-C 02/05/16 0225    Derwood KaplanAnkit Nanavati, MD 02/05/16 [id number]

## 2016-02-05 NOTE — ED Triage Notes (Signed)
Pt dx with strep on Sunday and is on augmentin, she complains of a right earache that started yesterday

## 2016-02-05 NOTE — Discharge Instructions (Signed)
Continue Augmentin as prescribed until finished. Take ibuprofen for pain control. Use Hycet as needed for persistent sore throat. Do not take additional tylenol as there is already tylenol in Hycet. Follow up with an ENT doctor if symptoms persist. Return for any new or concerning symptoms.

## 2016-03-07 ENCOUNTER — Encounter (HOSPITAL_COMMUNITY): Payer: Self-pay

## 2016-03-07 ENCOUNTER — Emergency Department (HOSPITAL_COMMUNITY)
Admission: EM | Admit: 2016-03-07 | Discharge: 2016-03-07 | Disposition: A | Discharge status: Home / Self Care | Payer: Medicaid Other | Attending: Emergency Medicine | Admitting: Emergency Medicine

## 2016-03-07 DIAGNOSIS — Z79899 Other long term (current) drug therapy: Secondary | ICD-10-CM | POA: Insufficient documentation

## 2016-03-07 DIAGNOSIS — N61 Mastitis without abscess: Secondary | ICD-10-CM

## 2016-03-07 DIAGNOSIS — N644 Mastodynia: Secondary | ICD-10-CM | POA: Diagnosis present

## 2016-03-07 DIAGNOSIS — F17210 Nicotine dependence, cigarettes, uncomplicated: Secondary | ICD-10-CM | POA: Diagnosis not present

## 2016-03-07 MED ORDER — CEPHALEXIN 500 MG PO CAPS: 500.0000 mg | ORAL_CAPSULE | Freq: Three times a day (TID) | ORAL | 0 refills | Status: DC

## 2016-03-07 NOTE — ED Provider Notes (Signed)
[facility]-EMERGENCY DEPT Provider Note   CSN: [id number] Arrival date & time: 03/07/16  0555     History   Chief Complaint Chief Complaint  Patient presents with  . Breast Pain    HPI Alexandria Gutierrez is a 35 y.o. female.  HPI complains of right breast pain onset 3 days ago. Pain is worse with palpating her breast or with bending forward. Not improved by anything. He has treated herself with ibuprofen with partial relief. Associated symptoms include chills and subjective fever 3 days ago which have since resolved. She is treated herself with Augmentin from an old prescription. No other associated symptoms. Patient currently breast-feeding.  Past Medical History:  Diagnosis Date  . Abnormal finding on Pap smear 01/2011   HSIL  . HSIL (high grade squamous intraepithelial lesion) on Pap smear 04/12/2011  . Postpartum care following vaginal delivery (08/04/13) 08/04/2013  . SVD (spontaneous vaginal delivery) 08/04/2013    Patient Active Problem List   Diagnosis Date Noted  . Antepartum placental abruption 01/16/2016  . Rh negative state in antepartum period 01/16/2016  . Status post primary low transverse cesarean section--complete abruption 01/16/2016  . Smoker 01/16/2016  . CIN II (cervical intraepithelial neoplasia II) 04/12/2011    Past Surgical History:  Procedure Laterality Date  . CESAREAN SECTION N/A 01/16/2016   Procedure: CESAREAN SECTION;  Surgeon: Myna HidalgoJennifer Ozan, DO;  Location: [facility];  Service: Obstetrics;  Laterality: N/A;  . CYSTECTOMY  26 or 35 years old   on wrist  . WISDOM TOOTH EXTRACTION      OB History    Gravida Para Term Preterm AB Living   3 3 2 1  0 3   SAB TAB Ectopic Multiple Live Births   0 0 0 0 2       Home Medications    Prior to Admission medications   Medication Sig Start Date End Date Taking? Authorizing Provider  cephALEXin (KEFLEX) 500 MG capsule Take 1 capsule (500 mg total) by mouth 3 (three) times daily. 03/07/16   Doug SouSam  Ronesha Heenan, MD  HYDROcodone-acetaminophen (HYCET) 7.5-325 mg/15 ml solution Take 15 mLs by mouth every 8 (eight) hours as needed for moderate pain. 02/05/16   Antony MaduraKelly Humes, PA-C  ibuprofen (CHILD IBUPROFEN) 100 MG/5ML suspension Take 30 mLs (600 mg total) by mouth every 6 (six) hours as needed. 02/05/16   Antony MaduraKelly Humes, PA-C  Prenatal Vit-Min-FA-Fish Oil ([facility] PRENATAL GUMMY PO) Take 2 tablets by mouth daily.    Historical Provider, MD    Family History Family History  Problem Relation Age of Onset  . Alcohol abuse Maternal Aunt     alcohol  . Mental illness Maternal Aunt     Social History Social History  Substance Use Topics  . Smoking status: Current Every Day Smoker    Packs/day: 0.50    Types: Cigarettes  . Smokeless tobacco: Never Used  . Alcohol use 0.0 oz/week    1 - 3 Glasses of wine per week    Positive marijuana use Allergies   Erythromycin   Review of Systems Review of Systems  Constitutional: Positive for chills and fever.  HENT: Negative.   Respiratory: Negative.   Cardiovascular: Negative.   Gastrointestinal: Negative.   Endocrine:       Currently lactating  Skin: Negative.   Allergic/Immunologic: Negative.   Neurological: Negative.   Psychiatric/Behavioral: Negative.   All other systems reviewed and are negative.    Physical Exam Updated Vital Signs BP 107/64 (BP Location: Right Arm)  Pulse 93   Temp 98.4 F (36.9 C) (Oral)   Resp 20   SpO2 95%   Physical Exam  Constitutional: She appears well-developed and well-nourished. No distress.  HENT:  Head: Normocephalic and atraumatic.  Eyes: EOM are normal.  Neck: Neck supple. No tracheal deviation present. No thyromegaly present.  Cardiovascular: Normal rate.   Pulmonary/Chest: Effort normal.  Abdominal: She exhibits no distension.  Musculoskeletal: Normal range of motion. She exhibits no edema or tenderness.  Neurological: She is alert. Coordination normal.  Skin: Skin is warm and dry. No rash  noted.  Right breast warm, no massesred and add tender surrounding nipple nipple appear normal  No axillary nodes, left breast normal  Psychiatric: She has a normal mood and affect.  Nursing note and vitals reviewed.    ED Treatments / Results  Labs (all labs ordered are listed, but only abnormal results are displayed) Labs Reviewed - No data to display  EKG  EKG Interpretation None       Radiology No results found.  Procedures Procedures (including critical care time)  Medications Ordered in ED Medications - No data to display   Initial Impression / Assessment and Plan / ED Course  I have reviewed the triage vital signs and the nursing notes.  Pertinent labs & imaging results that were available during my care of the patient were reviewed by me and considered in my medical decision making (see chart for details).  Clinical Course    Plan prescription Keflex, ibuprofen or Tylenol for pain. Follow up with Dr. Stefano Gaul if not better next week  Final Clinical Impressions(s) / ED Diagnoses  Diagnosis acute mastitis, right side Final diagnoses:  Mastitis, acute    New Prescriptions New Prescriptions   CEPHALEXIN (KEFLEX) 500 MG CAPSULE    Take 1 capsule (500 mg total) by mouth 3 (three) times daily.     Doug Sou, MD 03/07/16 [phone]

## 2016-03-07 NOTE — ED Triage Notes (Signed)
Patient c/o right breast pain.  Patient states she has a clogged duct.  Breast has become painful and red.  Patient states has been running fevers at home and having flu like symptoms.  Patient has been taking ibuprofen for fever and pain.

## 2016-03-07 NOTE — Discharge Instructions (Signed)
Continue to pump your breasts regularly. Breast milk is safe for the baby. Take Tylenol or ibuprofen for pain. Contact Dr. Stefano GaulStringer to arrange to be seen if not improving 4 or 5 days

## 2016-03-07 NOTE — ED Notes (Signed)
Advised patient to please change into gown due to her complaint, also advised to have her remove jeans for a full exam, patient refused to remove jeans, but did change into gown.

## 2016-12-28 DIAGNOSIS — R1084 Generalized abdominal pain: Secondary | ICD-10-CM | POA: Diagnosis not present

## 2016-12-28 DIAGNOSIS — A084 Viral intestinal infection, unspecified: Secondary | ICD-10-CM | POA: Diagnosis not present

## 2016-12-28 DIAGNOSIS — R112 Nausea with vomiting, unspecified: Secondary | ICD-10-CM | POA: Diagnosis not present

## 2017-03-30 ENCOUNTER — Ambulatory Visit (INDEPENDENT_AMBULATORY_CARE_PROVIDER_SITE_OTHER): Payer: 59 | Admitting: Physician Assistant

## 2017-03-30 ENCOUNTER — Other Ambulatory Visit (INDEPENDENT_AMBULATORY_CARE_PROVIDER_SITE_OTHER): Payer: 59

## 2017-03-30 ENCOUNTER — Ambulatory Visit (INDEPENDENT_AMBULATORY_CARE_PROVIDER_SITE_OTHER)
Admission: RE | Admit: 2017-03-30 | Discharge: 2017-03-30 | Disposition: A | Discharge status: Home / Self Care | Payer: 59 | Source: Ambulatory Visit | Attending: Physician Assistant | Admitting: Physician Assistant

## 2017-03-30 ENCOUNTER — Encounter: Payer: Self-pay | Admitting: Physician Assistant

## 2017-03-30 VITALS — BP 110/60 | HR 84 | Ht 63.0 in | Wt 132.0 lb

## 2017-03-30 DIAGNOSIS — R140 Abdominal distension (gaseous): Secondary | ICD-10-CM

## 2017-03-30 DIAGNOSIS — R1084 Generalized abdominal pain: Secondary | ICD-10-CM

## 2017-03-30 DIAGNOSIS — R110 Nausea: Secondary | ICD-10-CM | POA: Diagnosis not present

## 2017-03-30 DIAGNOSIS — R109 Unspecified abdominal pain: Secondary | ICD-10-CM | POA: Diagnosis not present

## 2017-03-30 LAB — COMPREHENSIVE METABOLIC PANEL
ALBUMIN: 4.8 g/dL (ref 3.5–5.2)
ALK PHOS: 61 U/L (ref 39–117)
ALT: 7 U/L (ref 0–35)
AST: 9 U/L (ref 0–37)
BILIRUBIN TOTAL: 0.5 mg/dL (ref 0.2–1.2)
BUN: 10 mg/dL (ref 6–23)
CALCIUM: 10.4 mg/dL (ref 8.4–10.5)
CO2: 28 mEq/L (ref 19–32)
Chloride: 104 mEq/L (ref 96–112)
Creatinine, Ser: 0.63 mg/dL (ref 0.40–1.20)
GFR: 113.64 mL/min (ref >60.00)
Glucose, Bld: 126 mg/dL — ABNORMAL HIGH (ref 70–99)
POTASSIUM: 4.4 meq/L (ref 3.5–5.1)
Sodium: 140 mEq/L (ref 135–145)
TOTAL PROTEIN: 8 g/dL (ref 6.0–8.3)

## 2017-03-30 LAB — CBC WITH DIFFERENTIAL/PLATELET
BASOS ABS: 0.1 10*3/uL (ref 0.0–0.1)
Basophils Relative: 1 % (ref 0.0–3.0)
EOS PCT: 1.6 % (ref 0.0–5.0)
Eosinophils Absolute: 0.2 10*3/uL (ref 0.0–0.7)
HEMATOCRIT: 44.6 % (ref 36.0–46.0)
HEMOGLOBIN: 14.9 g/dL (ref 12.0–15.0)
LYMPHS ABS: 3.3 10*3/uL (ref 0.7–4.0)
LYMPHS PCT: 29.6 % (ref 12.0–46.0)
MCHC: 33.3 g/dL (ref 30.0–36.0)
MCV: 93.3 fl (ref 78.0–100.0)
MONOS PCT: 7.4 % (ref 3.0–12.0)
Monocytes Absolute: 0.8 10*3/uL (ref 0.1–1.0)
NEUTROS PCT: 60.4 % (ref 43.0–77.0)
Neutro Abs: 6.8 10*3/uL (ref 1.4–7.7)
Platelets: 260 10*3/uL (ref 150.0–400.0)
RBC: 4.78 Mil/uL (ref 3.87–5.11)
RDW: 13 % (ref 11.5–15.5)
WBC: 11.2 10*3/uL — ABNORMAL HIGH (ref 4.0–10.5)

## 2017-03-30 NOTE — Patient Instructions (Signed)
Your provider has requested that you have an abdominal x ray before leaving today. Please go to the basement floor to our Radiology department for the test.  Your physician has requested that you go to the basement for the following lab work before leaving today: CMET, CBC

## 2017-03-31 ENCOUNTER — Other Ambulatory Visit: Payer: Self-pay

## 2017-03-31 ENCOUNTER — Other Ambulatory Visit: Payer: Self-pay | Admitting: Physician Assistant

## 2017-03-31 ENCOUNTER — Telehealth: Payer: Self-pay | Admitting: Physician Assistant

## 2017-03-31 ENCOUNTER — Telehealth: Payer: Self-pay

## 2017-03-31 ENCOUNTER — Ambulatory Visit (INDEPENDENT_AMBULATORY_CARE_PROVIDER_SITE_OTHER)
Admission: RE | Admit: 2017-03-31 | Discharge: 2017-03-31 | Disposition: A | Discharge status: Home / Self Care | Payer: 59 | Source: Ambulatory Visit | Attending: Physician Assistant | Admitting: Physician Assistant

## 2017-03-31 ENCOUNTER — Encounter: Payer: Self-pay | Admitting: Physician Assistant

## 2017-03-31 DIAGNOSIS — R1084 Generalized abdominal pain: Secondary | ICD-10-CM

## 2017-03-31 DIAGNOSIS — R112 Nausea with vomiting, unspecified: Secondary | ICD-10-CM

## 2017-03-31 DIAGNOSIS — R109 Unspecified abdominal pain: Secondary | ICD-10-CM | POA: Diagnosis not present

## 2017-03-31 DIAGNOSIS — R140 Abdominal distension (gaseous): Secondary | ICD-10-CM

## 2017-03-31 MED ORDER — METRONIDAZOLE 250 MG PO TABS: 250.0000 mg | ORAL_TABLET | Freq: Three times a day (TID) | ORAL | 0 refills | Status: DC

## 2017-03-31 MED ORDER — IOPAMIDOL (ISOVUE-300) INJECTION 61%
100.0000 mL | Freq: Once | INTRAVENOUS | Status: AC | PRN
  Administered 2017-03-31: 100 mL via INTRAVENOUS

## 2017-03-31 MED ORDER — OXYCODONE-ACETAMINOPHEN 5-325 MG PO TABS: 1.0000 | ORAL_TABLET | Freq: Four times a day (QID) | ORAL | 0 refills | Status: DC | PRN

## 2017-03-31 MED ORDER — CIPROFLOXACIN HCL 500 MG PO TABS: 500.0000 mg | ORAL_TABLET | Freq: Two times a day (BID) | ORAL | 0 refills | Status: DC

## 2017-03-31 NOTE — Telephone Encounter (Signed)
-----   Message from Unk LightningJennifer Lynne Lemmon, GeorgiaPA sent at 03/31/2017 11:07 AM EST ----- Regarding: Results Minimal increase in WBC. CMP and Xray normal. CT abdomen/pelvis with contrast has benn ordered as discussed this morning. Thanks-JLL  ----- Message ----- From: Brandt Loosenaylor, Carlin Attridge L, RN Sent: 03/31/2017  10:00 AM To: Unk LightningJennifer Lynne Lemmon, PA

## 2017-03-31 NOTE — Telephone Encounter (Signed)
The pt has been advised of the appt and contrast given.     You have been scheduled for a CT scan of the abdomen and pelvis at [facility] ([address]---this is in the same building as [facility]).   You are scheduled on TODAY at 2:15 pm. You should arrive 15 minutes prior to your appointment time for registration. Please follow the written instructions below on the day of your exam:  WARNING: IF YOU ARE ALLERGIC TO IODINE/X-RAY DYE, PLEASE NOTIFY RADIOLOGY IMMEDIATELY AT [phone]! YOU WILL BE GIVEN A 13 HOUR PREMEDICATION PREP.  1) Do not eat or drink anything after 1015 am (4 hours prior to your test) 2) You have been given 2 bottles of oral contrast to drink. The solution may taste better if refrigerated, but do NOT add ice or any other liquid to this solution. Shake             well before drinking.    Drink 1 bottle of contrast @ 12:15 pm (2 hours prior to your exam)  Drink 1 bottle of contrast @ 1:15 pm (1 hour prior to your exam)  You may take any medications as prescribed with a small amount of water except for the following: Metformin, Glucophage, Glucovance, Avandamet, Riomet, Fortamet, Actoplus Met, Janumet, Glumetza or Metaglip. The above medications must be held the day of the exam AND 48 hours after the exam.  The purpose of you drinking the oral contrast is to aid in the visualization of your intestinal tract. The contrast solution may cause some diarrhea. Before your exam is started, you will be given a small amount of fluid to drink. Depending on your individual set of symptoms, you may also receive an intravenous injection of x-ray contrast/dye. Plan on being at [facility] for 30 minutes or longer, depending on the type of exam you are having performed.  This test typically takes 30-45 minutes to complete.  If you have any questions regarding your exam or if you need to reschedule, you may call the CT department at [phone] between the  hours of 8:00 am and 5:00 pm, Monday-Friday.  ______________________________________________ 

## 2017-03-31 NOTE — Progress Notes (Signed)
Chief Complaint: Abdominal pain, Bloating, Nausea  HPI:    Alexandria Gutierrez is a 36 year old Caucasian female with a past medical history as listed below, who presents to clinic today as a new patient for complaint of abdominal pain, bloating and nausea.      Today, the patient describes that [place], and on her way back on Sunday she started with abdominal discomfort.  She noted a lot of excessive gas and grumbling.  This started in her left upper quadrant and has since radiated down slightly and now over to the other side of her abdomen over the past few days.  Patient tells me that this pain has worsened on a gradual basis since Sunday and last night she could only get comfort if she "laid on my back with a pillow under my feet".  Patient tells me that she has become extremely bloated and now looks pregnant.  She describes the left-sided pain as a feeling of a "postpartum massage".  She tells me that when she urinates or defecates sometimes this makes the pressure/pain worse.  Patient does tell me that she has been having formed fairly normal bowel movements and has been passing a lot of gas.  The last time the patient remembers passing gas was this morning walking into work.  The last time she passed a bowel movement was yesterday.  Looking back, the patient does tell me that she feels as though her bowel movements have decreased in size over the past few days.  Associated symptoms include nausea which started this morning and has worsened throughout the day.  Patient denies any vomiting.    Patient surgical history is positive for C-section last year.  Patient's social history is positive for eating a lot of foods that "I normally wouldn't eat" while on vacation in the mountains.  She does admit to eating a lot of "sweets" on Sunday as this was her birthday. Patient does work as a Clinical biochemist for [employer].    Patient denies fever, chills,  shortness of breath, chest pain, heartburn or reflux.  Past Medical History:  Diagnosis Date  . Abnormal finding on Pap smear 01/2011   HSIL  . History of blood transfusion 01/16/2016  . HSIL (high grade squamous intraepithelial lesion) on Pap smear 04/12/2011  . Postpartum care following vaginal delivery (08/04/13) 08/04/2013  . SVD (spontaneous vaginal delivery) 08/04/2013    Past Surgical History:  Procedure Laterality Date  . CYSTECTOMY Right 50 or 36 years old   on wrist  . WISDOM TOOTH EXTRACTION      Current Outpatient Medications  Medication Sig Dispense Refill  . levonorgestrel (MIRENA) 20 MCG/24HR IUD 1 each once by Intrauterine route.     No current facility-administered medications for this visit.     Allergies as of 03/30/2017 - Review Complete 03/30/2017  Allergen Reaction Noted  . Erythromycin Nausea And Vomiting 04/12/2011    Family History  Problem Relation Age of Onset  . Alcohol abuse Maternal Aunt   . Mental illness Maternal Aunt   . Endometriosis Mother   . Heart disease Maternal Grandmother   . Heart disease Maternal Grandfather   . Stroke Paternal Grandfather   . Colon cancer Neg Hx   . Esophageal cancer Neg Hx   . Rectal cancer Neg Hx     Social History   Socioeconomic History  . Marital status: Married    Spouse name: Not on  file  . Number of children: 3  . Years of education: Not on file  . Highest education level: Not on file  Social Needs  . Financial resource strain: Not on file  . Food insecurity - worry: Not on file  . Food insecurity - inability: Not on file  . Transportation needs - medical: Not on file  . Transportation needs - non-medical: Not on file  Occupational History  . Occupation: CMA  Tobacco Use  . Smoking status: Current Every Day Smoker    Packs/day: 0.50    Years: 22.00    Pack years: 11.00    Types: Cigarettes  . Smokeless tobacco: Never Used  . Tobacco comment: Tobacco info given 03/30/17  Substance and  Sexual Activity  . Alcohol use: Yes    Alcohol/week: 0.0 oz    Types: 1 - 3 Glasses of wine per week  . Drug use: Yes    Types: Marijuana    Comment: before preg.  Marland Kitchen. Sexual activity: Yes    Partners: Male    Birth control/protection: IUD    Comment: pregnant  Other Topics Concern  . Not on file  Social History Narrative  . Not on file    Review of Systems:    Constitutional: No weight loss, fever or chills Skin: No rash  Cardiovascular: No chest pain Respiratory: No SOB Gastrointestinal: See HPI and otherwise negative Genitourinary: No dysuria or change in urinary frequency Neurological: No headache Musculoskeletal: No new muscle or joint pain Hematologic: No bleeding Psychiatric: No history of depression or anxiety   Physical Exam:  Vital signs: BP 110/60   Pulse 84   Ht 5\' 3"  (1.6 m)   Wt 132 lb (59.9 kg)   BMI 23.38 kg/m   Constitutional:   Pleasant Caucasian female appears to be in mild distress, Well developed, Well nourished, alert and cooperative Head:  Normocephalic and atraumatic. Eyes:   PEERL, EOMI. No icterus. Conjunctiva pink. Ears:  Normal auditory acuity. Neck:  Supple Throat: Oral cavity and pharynx without inflammation, swelling or lesion.  Respiratory: Respirations even and unlabored. Lungs clear to auscultation bilaterally.   No wheezes, crackles, or rhonchi.  Cardiovascular: Normal S1, S2. No MRG. Regular rate and rhythm. No peripheral edema, cyanosis or pallor.  Gastrointestinal:  Soft, Markedly distended abdomen, Moderate generalized ttp (slighlty worse on the left), hypertympanic to percussion, decreased BS in all four quadrants  Rectal:  Not performed.  Msk:  Symmetrical without gross deformities. Without edema, no deformity or joint abnormality.  Neurologic:  Alert and  oriented x4;  grossly normal neurologically.  Skin:   Dry and intact without significant lesions or rashes. Psychiatric: Demonstrates good judgement and reason without  abnormal affect or behaviors.  RELEVANT LABS AND IMAGING: CBC    Component Value Date/Time   WBC 11.2 (H) 03/30/2017 1455   RBC 4.78 03/30/2017 1455   HGB 14.9 03/30/2017 1455   HCT 44.6 03/30/2017 1455   PLT 260.0 03/30/2017 1455   MCV 93.3 03/30/2017 1455   MCH 31.4 01/17/2016 0524   MCHC 33.3 03/30/2017 1455   RDW 13.0 03/30/2017 1455   LYMPHSABS 3.3 03/30/2017 1455   MONOABS 0.8 03/30/2017 1455   EOSABS 0.2 03/30/2017 1455   BASOSABS 0.1 03/30/2017 1455    CMP     Component Value Date/Time   NA 140 03/30/2017 1455   K 4.4 03/30/2017 1455   CL 104 03/30/2017 1455   CO2 28 03/30/2017 1455   GLUCOSE 126 (H) 03/30/2017 1455  BUN 10 03/30/2017 1455   CREATININE 0.63 03/30/2017 1455   CALCIUM 10.4 03/30/2017 1455   PROT 8.0 03/30/2017 1455   ALBUMIN 4.8 03/30/2017 1455   AST 9 03/30/2017 1455   ALT 7 03/30/2017 1455   ALKPHOS 61 03/30/2017 1455   BILITOT 0.5 03/30/2017 1455   GFRNONAA >60 01/16/2016 0447   GFRAA >60 01/16/2016 0447    Assessment: 1.  Abdominal pain: Started acutely 3 days ago, worsening, now with nausea and marked abdominal bloating, history of C-section in the past; concern for ileus versus obstruction versus colitis versus other 2.  Bloating: With above 3.  Nausea: With above  Plan: 1.  Discussed with the patient that her exam reveals hypertympanic bowel sounds and markedly distended abdomen, suspect either bowel blockage/partial blockage or ileus. 2.  If x-ray is unrevealing will recommend a CT abdomen pelvis with contrast for further evaluation 3.  Order labs to include a CBC and CMP.  These resulted as above. 4.  Patient was encouraged to maintain a liquid diet until further evaluation. 5.  Patient was discussed briefly with Dr. Lavon PaganiniNandigam today.  Patient will follow in clinic per recommendations after imaging and testing above. 6.  Did discuss with the patient that if her pain gets worse, she has rectal bleeding, vomiting or develops a fever  or chills she should proceed to the ER.  Hyacinth MeekerJennifer Chuong Casebeer, PA-C [facility] 03/31/2017, 9:38 AM  Cc: Lucky CowboyMcKeown, William, MD

## 2017-03-31 NOTE — Telephone Encounter (Signed)
Spoke with patient regarding recent CT showing appendagitis. Discussed Cipro/Flagyl x5 days and Percocet. Also instructed her to remain on liquid diet for the next 4-5 days while experiencing abdominal pain. JLL

## 2017-03-31 NOTE — Telephone Encounter (Signed)
The pt was notified and will have CT scan today

## 2017-04-01 ENCOUNTER — Other Ambulatory Visit: Payer: Self-pay | Admitting: Emergency Medicine

## 2017-04-01 MED ORDER — CIPROFLOXACIN HCL 500 MG PO TABS: 500.0000 mg | ORAL_TABLET | Freq: Two times a day (BID) | ORAL | 0 refills | Status: AC

## 2017-04-01 MED ORDER — OXYCODONE-ACETAMINOPHEN 5-325 MG PO TABS: 1.0000 | ORAL_TABLET | Freq: Four times a day (QID) | ORAL | 0 refills | Status: DC | PRN

## 2017-04-01 MED ORDER — METRONIDAZOLE 250 MG PO TABS: 250.0000 mg | ORAL_TABLET | Freq: Three times a day (TID) | ORAL | 0 refills | Status: AC

## 2017-04-01 NOTE — Progress Notes (Signed)
Reviewed and agree with documentation and assessment and plan. [name] , MD   

## 2017-04-07 ENCOUNTER — Encounter: Payer: Self-pay | Admitting: Physician Assistant

## 2017-04-07 ENCOUNTER — Ambulatory Visit (INDEPENDENT_AMBULATORY_CARE_PROVIDER_SITE_OTHER): Payer: 59 | Admitting: Physician Assistant

## 2017-04-07 VITALS — BP 124/68 | HR 72 | Ht 63.0 in | Wt 132.4 lb

## 2017-04-07 DIAGNOSIS — K529 Noninfective gastroenteritis and colitis, unspecified: Secondary | ICD-10-CM

## 2017-04-07 DIAGNOSIS — K6389 Other specified diseases of intestine: Secondary | ICD-10-CM

## 2017-04-07 NOTE — Progress Notes (Signed)
Chief Complaint: Follow-up epiploic appendagitis  HPI:    Alexandria Gutierrez is a 36 year old Caucasian female with a past medical history as below, who returns to clinic today for follow-up of her epiploic appendagitis.    Please recall patient was initially seen in clinic 03/30/17 with acute onset of abdominal distention and pain.  Labs including CBC and CMP were done which revealed a minor elevation in white count at 11.2 and otherwise unrevealing  and patient had an abdominal x-ray which showed a normal gas pattern.  CT of the abdomen and pelvis was then ordered and showed findings consistent with epiploic appendagitis along the anterior margin of the sigmoid colon.    Patient was started on 5 days of Cipro and Flagyl as well as Percocet for pain control.    Today, the patient presents to clinic and explains that she has been abiding by a liquid diet for the most part, but did have an Eggo waffle yesterday and did have some corresponding abdominal pain. She went to sleep and when she woke up she was "perfectly fine". Patient tells me that she could bend over and touch her toes with no pain this morning. She feels completely well. In fact, she even ate some normal food for lunch. She did finish her abx yesterday and has not used a Percocet since Monday night. She has no further questions or concerns. Patient denies fever or chills or continued abdominal bloating.   Past Medical History:  Diagnosis Date  . Abnormal finding on Pap smear 01/2011   HSIL  . History of blood transfusion 01/16/2016  . HSIL (high grade squamous intraepithelial lesion) on Pap smear 04/12/2011  . Postpartum care following vaginal delivery (08/04/13) 08/04/2013  . SVD (spontaneous vaginal delivery) 08/04/2013    Past Surgical History:  Procedure Laterality Date  . CESAREAN SECTION N/A 01/16/2016   Procedure: CESAREAN SECTION;  Surgeon: Myna HidalgoJennifer Ozan, DO;  Location: [facility];  Service: Obstetrics;  Laterality: N/A;    . CYSTECTOMY Right 89 or 36 years old   on wrist  . WISDOM TOOTH EXTRACTION      Current Outpatient Medications  Medication Sig Dispense Refill  . levonorgestrel (MIRENA) 20 MCG/24HR IUD 1 each once by Intrauterine route.     No current facility-administered medications for this visit.     Allergies as of 04/07/2017 - Review Complete 04/07/2017  Allergen Reaction Noted  . Erythromycin Nausea And Vomiting 04/12/2011    Family History  Problem Relation Age of Onset  . Alcohol abuse Maternal Aunt   . Mental illness Maternal Aunt   . Endometriosis Mother   . Heart disease Maternal Grandmother   . Heart disease Maternal Grandfather   . Stroke Paternal Grandfather   . Colon cancer Neg Hx   . Esophageal cancer Neg Hx   . Rectal cancer Neg Hx     Social History   Socioeconomic History  . Marital status: Married    Spouse name: Not on file  . Number of children: 3  . Years of education: Not on file  . Highest education level: Not on file  Social Needs  . Financial resource strain: Not on file  . Food insecurity - worry: Not on file  . Food insecurity - inability: Not on file  . Transportation needs - medical: Not on file  . Transportation needs - non-medical: Not on file  Occupational History  . Occupation: CMA  Tobacco Use  . Smoking status: Current Every Day Smoker  Packs/day: 0.50    Years: 22.00    Pack years: 11.00    Types: Cigarettes  . Smokeless tobacco: Never Used  . Tobacco comment: Tobacco info given 03/30/17  Substance and Sexual Activity  . Alcohol use: Yes    Alcohol/week: 0.0 oz    Types: 1 - 3 Glasses of wine per week  . Drug use: No    Comment: marijuana before preg. 4 years ago  . Sexual activity: Yes    Partners: Male    Birth control/protection: IUD    Comment: pregnant  Other Topics Concern  . Not on file  Social History Narrative  . Not on file    Review of Systems:    Constitutional: No weight loss, fever or chills Cardiovascular:  No chest pain Respiratory: No SOB  Gastrointestinal: See HPI and otherwise negative   Physical Exam:  Vital signs: BP 124/68 (BP Location: Left Arm, Patient Position: Sitting, Cuff Size: Normal)   Pulse 72   Ht 5\' 3"  (1.6 m) Comment: height measured without shoes  Wt 132 lb 6 oz (60 kg)   BMI 23.45 kg/m   Constitutional:   Pleasant Caucasian female appears to be in NAD, Well developed, Well nourished, alert and cooperative Respiratory: Respirations even and unlabored. Lungs clear to auscultation bilaterally.   No wheezes, crackles, or rhonchi.  Cardiovascular: Normal S1, S2. No MRG. Regular rate and rhythm. No peripheral edema, cyanosis or pallor.  Gastrointestinal:  Soft, nondistended, nontender. No rebound or guarding. Normal bowel sounds. No appreciable masses or hepatomegaly. Psychiatric:  Demonstrates good judgement and reason without abnormal affect or behaviors.  RELEVANT LABS AND IMAGING: CBC    Component Value Date/Time   WBC 11.2 (H) 03/30/2017 1455   RBC 4.78 03/30/2017 1455   HGB 14.9 03/30/2017 1455   HCT 44.6 03/30/2017 1455   PLT 260.0 03/30/2017 1455   MCV 93.3 03/30/2017 1455   MCH 31.4 01/17/2016 0524   MCHC 33.3 03/30/2017 1455   RDW 13.0 03/30/2017 1455   LYMPHSABS 3.3 03/30/2017 1455   MONOABS 0.8 03/30/2017 1455   EOSABS 0.2 03/30/2017 1455   BASOSABS 0.1 03/30/2017 1455    CMP     Component Value Date/Time   NA 140 03/30/2017 1455   K 4.4 03/30/2017 1455   CL 104 03/30/2017 1455   CO2 28 03/30/2017 1455   GLUCOSE 126 (H) 03/30/2017 1455   BUN 10 03/30/2017 1455   CREATININE 0.63 03/30/2017 1455   CALCIUM 10.4 03/30/2017 1455   PROT 8.0 03/30/2017 1455   ALBUMIN 4.8 03/30/2017 1455   AST 9 03/30/2017 1455   ALT 7 03/30/2017 1455   ALKPHOS 61 03/30/2017 1455   BILITOT 0.5 03/30/2017 1455   GFRNONAA >60 01/16/2016 0447   GFRAA >60 01/16/2016 0447   EXAM: CT ABDOMEN AND PELVIS WITH CONTRAST 03/31/17  TECHNIQUE: Multidetector CT imaging  of the abdomen and pelvis was performed using the standard protocol following bolus administration of intravenous contrast.  CONTRAST:  ISOVUE-300 IOPAMIDOL (ISOVUE-300) INJECTION 61%  COMPARISON:  Abdominal radiograph 03/30/2017  FINDINGS: Lower chest: Bandlike opacities compatible with atelectasis right lower lobe.  Hepatobiliary: Liver is normal in size and contour. Subcentimeter too small to characterize low-attenuation lesions within the left hepatic lobe (image 16; series 2) and adjacent to the gallbladder fossa (image 25; series 2). Gallbladder is unremarkable. No intrahepatic or extrahepatic biliary ductal dilatation.  Pancreas: Unremarkable  Spleen: Unremarkable  Adrenals/Urinary Tract: The adrenal glands are normal. Kidneys enhance symmetrically with contrast.  No hydronephrosis. 8 mm too small to characterize low-attenuation lesion interpolar region left kidney (image 22; series 2). Urinary bladder is unremarkable. No hydronephrosis.  Stomach/Bowel: Along the anterior margin of the sigmoid colon there is an inflamed 2.5 cm mass with central fat density (image 57; series 2) most compatible with epiploic appendagitis. No evidence for upstream bowel obstruction. No free fluid or free intraperitoneal air. Normal morphology of the stomach. Normal appendix.  Vascular/Lymphatic: Normal caliber abdominal aorta. No retroperitoneal lymphadenopathy.  Reproductive: Intrauterine device is present. Adnexal structures are unremarkable.  Other: None.  Musculoskeletal: No aggressive or acute appearing osseous lesions.  IMPRESSION: Findings most compatible with epiploic appendagitis along the anterior margin of the sigmoid colon.   Electronically Signed   By: Annia Beltrew  Davis M.D.   On: 03/31/2017 15:04  Assessment: 1. Epiploic Appendagitis: acute wit pain and abdominal distension, better after 5 days of Cipro/ flagyl and Percocet  Plan: 1. No  further complaints today. 2. Briefly discussed pathophysiology of epiploic appendagitis. 3. Patient may return to regular diet 4. Patient to follow in clinic with me or Dr. Lavon PaganiniNandigam as needed in the future.  Hyacinth MeekerJennifer Josimar Corning, PA-C [facility] 04/07/2017, 3:12 PM  Cc: Lucky CowboyMcKeown, William, MD

## 2017-04-11 NOTE — Progress Notes (Signed)
Reviewed and agree with documentation and assessment and plan. K. Veena Marylynn Rigdon , MD   

## 2017-07-22 DIAGNOSIS — H5213 Myopia, bilateral: Secondary | ICD-10-CM | POA: Diagnosis not present

## 2017-07-22 DIAGNOSIS — H52223 Regular astigmatism, bilateral: Secondary | ICD-10-CM | POA: Diagnosis not present

## 2017-07-22 DIAGNOSIS — H538 Other visual disturbances: Secondary | ICD-10-CM | POA: Diagnosis not present

## 2017-07-29 ENCOUNTER — Ambulatory Visit (INDEPENDENT_AMBULATORY_CARE_PROVIDER_SITE_OTHER): Payer: 59 | Admitting: Family Medicine

## 2017-07-29 ENCOUNTER — Encounter: Payer: Self-pay | Admitting: Family Medicine

## 2017-07-29 VITALS — BP 120/78 | HR 70 | Temp 98.4°F | Resp 12 | Ht 63.0 in | Wt 130.2 lb

## 2017-07-29 DIAGNOSIS — G4700 Insomnia, unspecified: Secondary | ICD-10-CM

## 2017-07-29 DIAGNOSIS — F331 Major depressive disorder, recurrent, moderate: Secondary | ICD-10-CM

## 2017-07-29 DIAGNOSIS — F332 Major depressive disorder, recurrent severe without psychotic features: Secondary | ICD-10-CM | POA: Insufficient documentation

## 2017-07-29 DIAGNOSIS — F339 Major depressive disorder, recurrent, unspecified: Secondary | ICD-10-CM | POA: Insufficient documentation

## 2017-07-29 LAB — BASIC METABOLIC PANEL
BUN: 10 mg/dL (ref 6–23)
CALCIUM: 10 mg/dL (ref 8.4–10.5)
CO2: 29 mEq/L (ref 19–32)
Chloride: 103 mEq/L (ref 96–112)
Creatinine, Ser: 0.59 mg/dL (ref 0.40–1.20)
GFR: 122.35 mL/min (ref >60.00)
Glucose, Bld: 76 mg/dL (ref 70–99)
POTASSIUM: 4.4 meq/L (ref 3.5–5.1)
Sodium: 139 mEq/L (ref 135–145)

## 2017-07-29 LAB — TSH: TSH: 0.5 u[IU]/mL (ref 0.35–4.50)

## 2017-07-29 MED ORDER — ARIPIPRAZOLE 2 MG PO TABS: 2.0000 mg | ORAL_TABLET | Freq: Every day | ORAL | 1 refills | Status: DC

## 2017-07-29 MED ORDER — BUPROPION HCL ER (SR) 150 MG PO TB12: 150.0000 mg | ORAL_TABLET | Freq: Two times a day (BID) | ORAL | 1 refills | Status: DC

## 2017-07-29 NOTE — Patient Instructions (Addendum)
A few things to remember from today's visit:   Insomnia, unspecified type - Plan: TSH, Basic metabolic panel  Mild episode of recurrent major depressive disorder (HCC) - Plan: buPROPion (WELLBUTRIN SR) 150 MG 12 hr tablet, ARIPiprazole (ABILIFY) 2 MG tablet   Please be sure medication list is accurate. If a new problem present, please set up appointment sooner than planned today.

## 2017-07-29 NOTE — Progress Notes (Signed)
HPI:   Alexandria Gutierrez is a 37 y.o. female, who is here today to establish care.  Former PCP: N/A Last preventive routine visit: 02/2016 with her gyn.  Chronic medical problems: Depression, past Hx of heroin and alcohol abuse, tobacco use disorder,and anxiety among some.  (10 years ago)   Concerns today: "mental health."  She has not been on medication for about 8 years and until 3 weeks ago she was able to deal with symptoms. It is now affecting her job, making mistakes because poor concentration.  In the past she was on Trazodone,Zoloft,Wellbutrin,and Abilify. Zoloft did not help. She did better when she was taking Wellbutrin and Abilify.  Difficulty staying asleep,she wakes up between 12-2 am and cannot back to sleep.This happens about 2-4 times per week.  She has tried Zyquil and Melatonin. "My mind races" "I think about everything and nothing."  Hospitalized for depression about 10 years ago.  She has not followed with psychiatrist in years. She states that she has not been Dx with bipolar but her aunt and MGM have bipolar disorder.    She lives with her husband and 2 children.   Review of Systems  Constitutional: Negative for activity change, appetite change, fatigue and fever.  HENT: Negative for mouth sores, nosebleeds and trouble swallowing.   Eyes: Negative for redness and visual disturbance.  Respiratory: Negative for cough, shortness of breath and wheezing.   Cardiovascular: Negative for chest pain, palpitations and leg swelling.  Gastrointestinal: Negative for abdominal pain, nausea and vomiting.       Negative for changes in bowel habits.  Endocrine: Negative for cold intolerance and heat intolerance.  Genitourinary: Negative for decreased urine volume, dysuria and hematuria.  Musculoskeletal: Negative for gait problem and myalgias.  Skin: Negative for rash.  Neurological: Negative for syncope, weakness and headaches.  Psychiatric/Behavioral:  Positive for decreased concentration and sleep disturbance. Negative for confusion and suicidal ideas. The patient is nervous/anxious.       Current Outpatient Medications on File Prior to Visit  Medication Sig Dispense Refill  . levonorgestrel (MIRENA) 20 MCG/24HR IUD 1 each once by Intrauterine route.     No current facility-administered medications on file prior to visit.      Past Medical History:  Diagnosis Date  . Abnormal finding on Pap smear 01/2011   HSIL  . Chicken pox   . Depression   . History of blood transfusion 01/16/2016  . HSIL (high grade squamous intraepithelial lesion) on Pap smear 04/12/2011  . Postpartum care following vaginal delivery (08/04/13) 08/04/2013  . SVD (spontaneous vaginal delivery) 08/04/2013   Allergies  Allergen Reactions  . Erythromycin Nausea And Vomiting    Spikes high fever to 103    Family History  Problem Relation Age of Onset  . Alcohol abuse Maternal Aunt   . Mental illness Maternal Aunt   . Endometriosis Mother   . Heart disease Maternal Grandmother   . Heart disease Maternal Grandfather   . Stroke Paternal Grandfather   . Colon cancer Neg Hx   . Esophageal cancer Neg Hx   . Rectal cancer Neg Hx     Social History   Socioeconomic History  . Marital status: Married    Spouse name: None  . Number of children: 3  . Years of education: None  . Highest education level: None  Social Needs  . Financial resource strain: None  . Food insecurity - worry: None  . Food insecurity - inability: None  .  Transportation needs - medical: None  . Transportation needs - non-medical: None  Occupational History  . Occupation: CMA  Tobacco Use  . Smoking status: Current Every Day Smoker    Packs/day: 0.50    Years: 22.00    Pack years: 11.00    Types: Cigarettes  . Smokeless tobacco: Never Used  . Tobacco comment: Tobacco info given 03/30/17  Substance and Sexual Activity  . Alcohol use: Yes    Alcohol/week: 0.0 oz    Types: 1 -  3 Glasses of wine per week  . Drug use: No    Comment: marijuana before preg. 4 years ago  . Sexual activity: Yes    Partners: Male    Birth control/protection: IUD    Comment: pregnant  Other Topics Concern  . None  Social History Narrative  . None    Vitals:   07/29/17 0826  BP: 120/78  Pulse: 70  Resp: 12  Temp: 98.4 F (36.9 C)  SpO2: 98%    Body mass index is 23.07 kg/m.   Physical Exam  Nursing note and vitals reviewed. Constitutional: She is oriented to person, place, and time. She appears well-developed and well-nourished. No distress.  HENT:  Head: Normocephalic and atraumatic.  Mouth/Throat: Oropharynx is clear and moist and mucous membranes are normal.  Eyes: Conjunctivae and EOM are normal. Pupils are equal, round, and reactive to light.  Neck: No tracheal deviation present. No thyroid mass and no thyromegaly (palpable) present.  Cardiovascular: Normal rate and regular rhythm.  No murmur heard. Pulses:      Dorsalis pedis pulses are 2+ on the right side, and 2+ on the left side.  Respiratory: Effort normal and breath sounds normal. No respiratory distress.  GI: Soft. She exhibits no mass. There is no hepatomegaly. There is no tenderness.  Musculoskeletal: She exhibits no edema or tenderness.  Lymphadenopathy:    She has no cervical adenopathy.  Neurological: She is alert and oriented to person, place, and time. She has normal strength. Gait normal.  Skin: Skin is warm. No erythema.  Psychiatric: Her mood appears anxious. Her affect is labile. She expresses no suicidal ideation.  Fairly groomed, good eye contact.      ASSESSMENT AND PLAN:   Ms. Alexandria Gutierrez was seen today for establish care.  Diagnoses and all orders for this visit:  Lab Results  Component Value Date   TSH 0.50 07/29/2017   Lab Results  Component Value Date   CREATININE 0.59 07/29/2017   BUN 10 07/29/2017   NA 139 07/29/2017   K 4.4 07/29/2017   CL 103 07/29/2017   CO2 29  07/29/2017    Insomnia, unspecified type  Good sleep hygiene. If not better with Abilify, will consider Trazodone next visit.  -     TSH -     Basic metabolic panel  Moderate episode of recurrent major depressive disorder (HCC)  Since she did well years ago with Wellbutrin and Abilify, medications started. Instructed about warning signs. F/U in 5 weeks,before if needed.  -     buPROPion (WELLBUTRIN SR) 150 MG 12 hr tablet; Take 1 tablet (150 mg total) by mouth 2 (two) times daily. -     ARIPiprazole (ABILIFY) 2 MG tablet; Take 1 tablet (2 mg total) by mouth daily.        Field Staniszewski G. [place], MD  [facility]. [place] office.

## 2017-07-30 ENCOUNTER — Encounter: Payer: Self-pay | Admitting: Family Medicine

## 2017-08-02 ENCOUNTER — Encounter: Payer: Self-pay | Admitting: Family Medicine

## 2017-08-05 ENCOUNTER — Encounter: Payer: Self-pay | Admitting: Family Medicine

## 2017-08-29 ENCOUNTER — Encounter: Payer: Self-pay | Admitting: Family Medicine

## 2017-08-29 ENCOUNTER — Ambulatory Visit (INDEPENDENT_AMBULATORY_CARE_PROVIDER_SITE_OTHER): Payer: 59 | Admitting: Family Medicine

## 2017-08-29 VITALS — BP 120/78 | HR 78 | Temp 98.6°F | Resp 12 | Ht 63.0 in | Wt 130.5 lb

## 2017-08-29 DIAGNOSIS — K429 Umbilical hernia without obstruction or gangrene: Secondary | ICD-10-CM | POA: Diagnosis not present

## 2017-08-29 DIAGNOSIS — F331 Major depressive disorder, recurrent, moderate: Secondary | ICD-10-CM

## 2017-08-29 MED ORDER — VENLAFAXINE HCL ER 37.5 MG PO CP24: 37.5000 mg | ORAL_CAPSULE | Freq: Every day | ORAL | 1 refills | Status: DC

## 2017-08-29 NOTE — Progress Notes (Signed)
HPI:   [name] is a 37 y.o. female, who is here today for a month follow up.   She was last seen on 07/29/17, when Abilify and Wellbutrin were started. Wellbutrin was discontinued after 4-5 days because she started with irritability. She is on Abilify 2 mg daily and has noted improvement in her mood. She feels more motivated to do things.  Sleeping "a little" better, she is taking Melatonin 3 mg at night.  She also has noted better performance at work. Still she feels like she is having trouble with concentration and finishing tasks.  She denies suicidal thoughts.   She is also c/o 2 days of navel pain. Intermittent soreness. It seems to hurt more with palpation, she has not identified alleviating factors. No skin changes or drainage. No Hx of trauma.  No abdominal pain, nausea, vomiting, changes in bowel habits, blood in stool or urinary symptoms.  She has not tried OTC analgesics.      Review of Systems  Constitutional: Negative for activity change, appetite change and fatigue.  Respiratory: Negative for chest tightness, shortness of breath and wheezing.   Cardiovascular: Negative for chest pain, palpitations and leg swelling.  Gastrointestinal: Negative for abdominal pain, nausea and vomiting.  Skin: Negative for rash and wound.  Neurological: Negative for tremors, seizures and headaches.  Psychiatric/Behavioral: Positive for decreased concentration. Negative for confusion, hallucinations, sleep disturbance and suicidal ideas. The patient is nervous/anxious.     [  Current Outpatient Medications on File Prior to Visit  Medication Sig Dispense Refill  . ARIPiprazole (ABILIFY) 2 MG tablet Take 1 tablet (2 mg total) by mouth daily. 30 tablet 1  . levonorgestrel (MIRENA) 20 MCG/24HR IUD 1 each once by Intrauterine route.     No current facility-administered medications on file prior to visit.      Past Medical History:  Diagnosis Date  .  Abnormal finding on Pap smear 01/2011   HSIL  . Chicken pox   . Depression   . History of blood transfusion 01/16/2016  . HSIL (high grade squamous intraepithelial lesion) on Pap smear 04/12/2011  . Postpartum care following vaginal delivery (08/04/13) 08/04/2013  . SVD (spontaneous vaginal delivery) 08/04/2013   Allergies  Allergen Reactions  . Erythromycin Nausea And Vomiting    Spikes high fever to 103    Social History   Socioeconomic History  . Marital status: Married    Spouse name: Not on file  . Number of children: 3  . Years of education: Not on file  . Highest education level: Not on file  Occupational History  . Occupation: CMA  Social Needs  . Financial resource strain: Not on file  . Food insecurity:    Worry: Not on file    Inability: Not on file  . Transportation needs:    Medical: Not on file    Non-medical: Not on file  Tobacco Use  . Smoking status: Current Every Day Smoker    Packs/day: 0.50    Years: 22.00    Pack years: 11.00    Types: Cigarettes  . Smokeless tobacco: Never Used  . Tobacco comment: Tobacco info given 03/30/17  Substance and Sexual Activity  . Alcohol use: Yes    Alcohol/week: 0.0 oz    Types: 1 - 3 Glasses of wine per week  . Drug use: No    Comment: marijuana before preg. 4 years ago  . Sexual activity: Yes    Partners: Male  Birth control/protection: IUD    Comment: pregnant  Lifestyle  . Physical activity:    Days per week: Not on file    Minutes per session: Not on file  . Stress: Not on file  Relationships  . Social connections:    Talks on phone: Not on file    Gets together: Not on file    Attends religious service: Not on file    Active member of club or organization: Not on file    Attends meetings of clubs or organizations: Not on file    Relationship status: Not on file  Other Topics Concern  . Not on file  Social History Narrative  . Not on file    Vitals:   08/29/17 1635  BP: 120/78  Pulse: 78    Resp: 12  Temp: 98.6 F (37 C)  SpO2: 96%   Body mass index is 23.12 kg/m.    Physical Exam  Nursing note and vitals reviewed. Constitutional: She is oriented to person, place, and time. She appears well-developed and well-nourished.  HENT:  Head: Normocephalic and atraumatic.  Eyes: Conjunctivae are normal.  Cardiovascular: Normal rate and regular rhythm.  No murmur heard. Respiratory: Effort normal and breath sounds normal.  GI: Soft. She exhibits no distension and no mass. There is no hepatomegaly. There is no tenderness. A hernia (umbilical,Able to reduce,it causes pain.) is present.  Musculoskeletal: She exhibits no edema.  Neurological: She is alert and oriented to person, place, and time. She has normal strength. Gait normal.  Skin: Skin is warm. No rash noted. No erythema.  Psychiatric: Her mood appears anxious. She expresses no suicidal ideation.  Well groomed, good eye contact.      ASSESSMENT AND PLAN:   Ms. Alexandria Gutierrez was seen today for a month follow-up.  Orders Placed This Encounter  Procedures  . Ambulatory referral to General Surgery    1. Umbilical hernia without obstruction and without gangrene  Small but because tender,I recommend surgery evaluation. She would like referral to Dr Daphine Deutscher. Instructed about warning signs.  - Ambulatory referral to General Surgery  2. Moderate episode of recurrent major depressive disorder (HCC)  Mood is more stable now but still not well controlled. She is tolerating Abilify well, so no changes. She has tried several medications in the past, she did take Effexor and doe snot recall why she discontinue. She agrees with trying Effexor 37.5 mg daily. We discussed some side effects. I gave her a list of psychiatrists in the area, so she can establish with one if Effexor does not help. She will let me know in 4-6 weeks how she is doing with new medication. Monitor for warning signs.  - venlafaxine XR  (EFFEXOR XR) 37.5 MG 24 hr capsule; Take 1 capsule (37.5 mg total) by mouth daily with breakfast.  Dispense: 30 capsule; Refill: 1      -Ms. Alexandria Gutierrez was advised to return sooner than planned today if new concerns arise.       Stefanie Hodgens G. [place], MD  [facility] [place] office.

## 2017-08-29 NOTE — Patient Instructions (Signed)
A few things to remember from today's visit:   Umbilical hernia without obstruction and without gangrene - Plan: Ambulatory referral to General Surgery  Moderate episode of recurrent major depressive disorder (HCC) - Plan: venlafaxine XR (EFFEXOR XR) 37.5 MG 24 hr capsule     PSYCHIATRIC OFFICES AND PSYCHIATRISTS IN THE AREA   -[facility] [phone] -[facility] [phone] - [facility] [phone] -[facility] [phone]  Dr Annabell SabalMichael F lefaive [phone] Dr Marguerita BeardsKenneth J. Headen, MD [phone] Dr Milagros Evenerupinder [facility], MD [phone]   Dr Jacqulynn Cadetarey G. Bernardo Heaterottle Jr, MD [phone] Dr Madie RenoIrving A. Lugo  Dr Andee PolesMcKinney, Parish, MD [phone]  Dr Mar DaringWangelin, Robert, MD [phone]    Please be sure medication list is accurate. If a new problem present, please set up appointment sooner than planned today.

## 2017-08-30 MED FILL — ARIPiprazole 2 MG TABS: 2 | 30 days supply | Qty: 30 | Fill #0

## 2017-09-26 ENCOUNTER — Encounter: Payer: Self-pay | Admitting: Family Medicine

## 2017-09-27 ENCOUNTER — Other Ambulatory Visit: Payer: Self-pay | Admitting: Family Medicine

## 2017-09-27 DIAGNOSIS — F331 Major depressive disorder, recurrent, moderate: Secondary | ICD-10-CM

## 2017-09-27 MED ORDER — VENLAFAXINE HCL ER 37.5 MG PO CP24: 37.5000 mg | ORAL_CAPSULE | Freq: Every day | ORAL | 0 refills | Status: DC

## 2017-09-27 MED FILL — VENLAFAXINE HCL ER 37.5 MG: 37.5 | 90 days supply | Qty: 90 | Fill #0

## 2017-09-28 ENCOUNTER — Other Ambulatory Visit: Payer: Self-pay | Admitting: Family Medicine

## 2017-09-28 DIAGNOSIS — F331 Major depressive disorder, recurrent, moderate: Secondary | ICD-10-CM

## 2017-09-28 MED FILL — ARIPiprazole 2 MG TABS: 2 | 90 days supply | Qty: 90 | Fill #0

## 2017-10-04 ENCOUNTER — Encounter: Payer: Self-pay | Admitting: Family

## 2017-10-04 ENCOUNTER — Ambulatory Visit: Payer: Self-pay | Admitting: Family

## 2017-10-04 VITALS — BP 108/70 | HR 75 | Temp 98.6°F | Resp 20 | Wt 131.0 lb

## 2017-10-04 DIAGNOSIS — J302 Other seasonal allergic rhinitis: Secondary | ICD-10-CM

## 2017-10-04 MED ORDER — FEXOFENADINE-PSEUDOEPHED ER 60-120 MG PO TB12: 1.0000 | ORAL_TABLET | Freq: Two times a day (BID) | ORAL | 0 refills | Status: DC

## 2017-10-04 NOTE — Progress Notes (Signed)
Subjective:     Patient ID: Alexandria Gutierrez, female   DOB: [birth year], 37 y.o.   MRN: [id number]  HPI 37 year old female is in today with c/o hoarseness, scratchy throat, congestion that began this morning. She works at [employer] and reports having 1 coworker with bronchitis and another with URI. She has not taken any meds. She denies fever.   Review of Systems  Constitutional: Negative.   HENT: Positive for congestion, postnasal drip and sore throat. Negative for sinus pain.   Eyes: Negative.   Respiratory: Negative.   Cardiovascular: Negative.   Endocrine: Negative.   Musculoskeletal: Negative.   Allergic/Immunologic: Negative.   Hematological: Negative.   Psychiatric/Behavioral: Positive for agitation.   Past Medical History:  Diagnosis Date  . Abnormal finding on Pap smear 01/2011   HSIL  . Chicken pox   . Depression   . History of blood transfusion 01/16/2016  . HSIL (high grade squamous intraepithelial lesion) on Pap smear 04/12/2011  . Postpartum care following vaginal delivery (08/04/13) 08/04/2013  . SVD (spontaneous vaginal delivery) 08/04/2013    Social History   Socioeconomic History  . Marital status: Married    Spouse name: Not on file  . Number of children: 3  . Years of education: Not on file  . Highest education level: Not on file  Occupational History  . Occupation: CMA  Social Needs  . Financial resource strain: Not on file  . Food insecurity:    Worry: Not on file    Inability: Not on file  . Transportation needs:    Medical: Not on file    Non-medical: Not on file  Tobacco Use  . Smoking status: Current Every Day Smoker    Packs/day: 0.50    Years: 22.00    Pack years: 11.00    Types: Cigarettes  . Smokeless tobacco: Never Used  . Tobacco comment: Tobacco info given 03/30/17  Substance and Sexual Activity  . Alcohol use: Yes    Alcohol/week: 0.0 oz    Types: 1 - 3 Glasses of wine per week  . Drug use: No    Comment: marijuana  before preg. 4 years ago  . Sexual activity: Yes    Partners: Male    Birth control/protection: IUD    Comment: pregnant  Lifestyle  . Physical activity:    Days per week: Not on file    Minutes per session: Not on file  . Stress: Not on file  Relationships  . Social connections:    Talks on phone: Not on file    Gets together: Not on file    Attends religious service: Not on file    Active member of club or organization: Not on file    Attends meetings of clubs or organizations: Not on file    Relationship status: Not on file  . Intimate partner violence:    Fear of current or ex partner: Not on file    Emotionally abused: Not on file    Physically abused: Not on file    Forced sexual activity: Not on file  Other Topics Concern  . Not on file  Social History Narrative  . Not on file    Past Surgical History:  Procedure Laterality Date  . CESAREAN SECTION N/A 01/16/2016   Procedure: CESAREAN SECTION;  Surgeon: Myna Hidalgo, DO;  Location: [facility];  Service: Obstetrics;  Laterality: N/A;  . CYSTECTOMY Right 45 or 37 years old   on wrist  .  WISDOM TOOTH EXTRACTION      Family History  Problem Relation Age of Onset  . Alcohol abuse Maternal Aunt   . Mental illness Maternal Aunt   . Endometriosis Mother   . Heart disease Maternal Grandmother   . Heart disease Maternal Grandfather   . Stroke Paternal Grandfather   . Colon cancer Neg Hx   . Esophageal cancer Neg Hx   . Rectal cancer Neg Hx     Allergies  Allergen Reactions  . Erythromycin Nausea And Vomiting    Spikes high fever to 103    Current Outpatient Medications on File Prior to Visit  Medication Sig Dispense Refill  . ARIPiprazole (ABILIFY) 2 MG tablet TAKE 1 TABLET BY MOUTH ONCE DAILY 90 tablet 0  . levonorgestrel (MIRENA) 20 MCG/24HR IUD 1 each once by Intrauterine route.    . venlafaxine XR (EFFEXOR XR) 37.5 MG 24 hr capsule Take 1 capsule (37.5 mg total) by mouth daily with breakfast. 90  capsule 0   No current facility-administered medications on file prior to visit.     BP 108/70 (BP Location: Right Arm, Patient Position: Sitting, Cuff Size: Normal)   Pulse 75   Temp 98.6 F (37 C) (Oral)   Resp 20   Wt 131 lb (59.4 kg)   SpO2 97%   BMI 23.21 kg/m chart    Objective:   Physical Exam  Constitutional: She is oriented to person, place, and time. She appears well-developed and well-nourished.  HENT:  Head: Normocephalic.  Mouth/Throat: Oropharynx is clear and moist.  Neck: Normal range of motion.  Cardiovascular: Normal rate and regular rhythm.  Pulmonary/Chest: Effort normal and breath sounds normal.  Neurological: She is alert and oriented to person, place, and time.  Skin: Skin is warm and dry.       Assessment:     Alexandria Gutierrez was seen today for cough, sore throat.  Diagnoses and all orders for this visit:  Seasonal allergies  Other orders -     fexofenadine-pseudoephedrine (ALLEGRA-D ALLERGY & CONGESTION) 60-120 MG 12 hr tablet; Take 1 tablet by mouth 2 (two) times daily.      Plan:     Call the office with any questions or concerns. Recheck as needed

## 2017-10-04 NOTE — Patient Instructions (Signed)
Hoarseness Hoarseness is any abnormal change in your voice.Hoarseness can make it difficult to speak. Your voice may sound raspy, breathy, or strained. Hoarseness is caused by a problem with the vocal cords. The vocal cords are two bands of tissue inside your voice box (larynx). When you speak, your vocal cords move back and forth to create sound. The surfaces of your vocal cords need to be smooth for your voice to sound clear. Swelling or lumps on the vocal cords can cause hoarseness. Common causes of vocal cord problems include:  Upper airway infection.  A long-term cough.  Straining or overusing your voice.  Smoking.  Allergies.  Vocal cord growths.  Stomach acids that flow up from your stomach and irritate your vocal cords (gastroesophageal reflux).  Follow these instructions at home: Watch your condition for any changes. To ease any discomfort that you feel:  Rest your voice. Do not whisper. Whispering can cause muscle strain.  Do not speak in a loud or harsh voice that makes your hoarseness worse.  Do not use any tobacco products, including cigarettes, chewing tobacco, or electronic cigarettes. If you need help quitting, ask your health care provider.  Avoid secondhand smoke.  Do not eat foods that give you heartburn. Heartburn can make gastroesophageal reflux worse.  Do not drink coffee.  Do not drink alcohol.  Drink enough fluids to keep your urine clear or pale yellow.  Use a humidifier if the air in your home is dry.  Contact a health care provider if:  You have hoarseness that lasts longer than 3 weeks.  You almost lose or completelylose your voice for longer than 3 days.  You have pain when you swallow or try to talk.  You feel a lump in your neck. Get help right away if:  You have trouble swallowing.  You feel as though you are choking when you swallow.  You cough up blood or vomit blood.  You have trouble breathing. This information is not  intended to replace advice given to you by your health care provider. Make sure you discuss any questions you have with your health care provider. Document Released: 04/23/2005 Document Revised: 10/16/2015 Document Reviewed: 05/01/2014 Elsevier Interactive Patient Education  2018 [employer].  Allergic Rhinitis, Adult Allergic rhinitis is an allergic reaction that affects the mucous membrane inside the nose. It causes sneezing, a runny or stuffy nose, and the feeling of mucus going down the back of the throat (postnasal drip). Allergic rhinitis can be mild to severe. There are two types of allergic rhinitis:  Seasonal. This type is also called hay fever. It happens only during certain seasons.  Perennial. This type can happen at any time of the year.  What are the causes? This condition happens when the body's defense system (immune system) responds to certain harmless substances called allergens as though they were germs.  Seasonal allergic rhinitis is triggered by pollen, which can come from grasses, trees, and weeds. Perennial allergic rhinitis may be caused by:  House dust mites.  Pet dander.  Mold spores.  What are the signs or symptoms? Symptoms of this condition include:  Sneezing.  Runny or stuffy nose (nasal congestion).  Postnasal drip.  Itchy nose.  Tearing of the eyes.  Trouble sleeping.  Daytime sleepiness.  How is this diagnosed? This condition may be diagnosed based on:  Your medical history.  A physical exam.  Tests to check for related conditions, such as: ? Asthma. ? Pink eye. ? Ear infection. ?  Upper respiratory infection.  Tests to find out which allergens trigger your symptoms. These may include skin or blood tests.  How is this treated? There is no cure for this condition, but treatment can help control symptoms. Treatment may include:  Taking medicines that block allergy symptoms, such as antihistamines. Medicine may be given as a  shot, nasal spray, or pill.  Avoiding the allergen.  Desensitization. This treatment involves getting ongoing shots until your body becomes less sensitive to the allergen. This treatment may be done if other treatments do not help.  If taking medicine and avoiding the allergen does not work, new, stronger medicines may be prescribed.  Follow these instructions at home:  Find out what you are allergic to. Common allergens include smoke, dust, and pollen.  Avoid the things you are allergic to. These are some things you can do to help avoid allergens: ? Replace carpet with wood, tile, or vinyl flooring. Carpet can trap dander and dust. ? Do not smoke. Do not allow smoking in your home. ? Change your heating and air conditioning filter at least once a month. ? During allergy season:  Keep windows closed as much as possible.  Plan outdoor activities when pollen counts are lowest. This is usually during the evening hours.  When coming indoors, change clothing and shower before sitting on furniture or bedding.  Take over-the-counter and prescription medicines only as told by your health care provider.  Keep all follow-up visits as told by your health care provider. This is important. Contact a health care provider if:  You have a fever.  You develop a persistent cough.  You make whistling sounds when you breathe (you wheeze).  Your symptoms interfere with your normal daily activities. Get help right away if:  You have shortness of breath. Summary  This condition can be managed by taking medicines as directed and avoiding allergens.  Contact your health care provider if you develop a persistent cough or fever.  During allergy season, keep windows closed as much as possible. This information is not intended to replace advice given to you by your health care provider. Make sure you discuss any questions you have with your health care provider. Document Released: 02/02/2001  Document Revised: 06/17/2016 Document Reviewed: 06/17/2016 Elsevier Interactive Patient Education  [employer].

## 2017-10-06 ENCOUNTER — Telehealth: Payer: 59 | Admitting: Physician Assistant

## 2017-10-06 DIAGNOSIS — J069 Acute upper respiratory infection, unspecified: Secondary | ICD-10-CM | POA: Diagnosis not present

## 2017-10-06 DIAGNOSIS — B9789 Other viral agents as the cause of diseases classified elsewhere: Secondary | ICD-10-CM | POA: Diagnosis not present

## 2017-10-06 MED ORDER — BENZONATATE 100 MG PO CAPS: 100.0000 mg | ORAL_CAPSULE | Freq: Three times a day (TID) | ORAL | 0 refills | Status: DC | PRN

## 2017-10-06 MED FILL — BENZONATATE 100 MG CAPS: 100 | 10 days supply | Qty: 30 | Fill #0

## 2017-10-06 NOTE — Progress Notes (Signed)

## 2017-11-14 ENCOUNTER — Encounter: Payer: Self-pay | Admitting: Family Medicine

## 2017-12-26 ENCOUNTER — Encounter: Payer: Self-pay | Admitting: Family Medicine

## 2017-12-26 ENCOUNTER — Ambulatory Visit (INDEPENDENT_AMBULATORY_CARE_PROVIDER_SITE_OTHER): Payer: 59 | Admitting: Family Medicine

## 2017-12-26 VITALS — BP 120/70 | HR 94 | Temp 98.4°F | Resp 12 | Ht 63.0 in | Wt 126.0 lb

## 2017-12-26 DIAGNOSIS — F331 Major depressive disorder, recurrent, moderate: Secondary | ICD-10-CM

## 2017-12-26 DIAGNOSIS — F410 Panic disorder [episodic paroxysmal anxiety] without agoraphobia: Secondary | ICD-10-CM | POA: Insufficient documentation

## 2017-12-26 DIAGNOSIS — F419 Anxiety disorder, unspecified: Secondary | ICD-10-CM

## 2017-12-26 DIAGNOSIS — R202 Paresthesia of skin: Secondary | ICD-10-CM

## 2017-12-26 MED ORDER — VENLAFAXINE HCL ER 75 MG PO CP24: 75.0000 mg | ORAL_CAPSULE | Freq: Every day | ORAL | 2 refills | Status: DC

## 2017-12-26 MED ORDER — ARIPIPRAZOLE 2 MG PO TABS: 2.0000 mg | ORAL_TABLET | Freq: Every day | ORAL | 0 refills | Status: DC

## 2017-12-26 NOTE — Progress Notes (Signed)
HPI:   [name] is a 37 y.o. female, who is here today for 3 months follow up.   She was last seen on 08/29/17.  On 11/14/2017 she was expressing concern about decreased concentration and requesting a low dose of Adderall.  I recommended arranging an appointment with psychiatrist, she has an appointment on 01/20/18.  Anxiety and depression, Effexor 37.5 mg daily helped initially but it seems like she "plateau." She is also on Abilify 2 mg daily. Still having issues with concentration at work, difficulty completing task.   She is following with counselor every 2 weeks.   She denies crying spells or suicidal thoughts.  Insomnia has improved, she is taking melatonin as needed.  She is sleeping about 4 to 6 hours, she attributes difficulty sleeping to her children. She denies manic-like symptoms.    Today she is complaining about episodes of numbness or tingling about 10 days ago. She was in the pool when she felt cold, she got out thinking it was the water.Then she started with numbness and tingling.   Tingling seemed to be exacerbated by touch. No alleviating factors identified. Problem lasted 3 hours.  At the time of symptoms she did not have headache, visual changes, palpitation, or focal deficit.  Frontal, throbbing headache about 8 days ago that lasted all day.   Headache has resolved.   Lab Results  Component Value Date   TSH 0.50 07/29/2017   Lab Results  Component Value Date   CREATININE 0.59 07/29/2017   BUN 10 07/29/2017   NA 139 07/29/2017   K 4.4 07/29/2017   CL 103 07/29/2017   CO2 29 07/29/2017    Review of Systems  Constitutional: Positive for fatigue. Negative for activity change, appetite change and fever.  HENT: Negative for mouth sores, nosebleeds and trouble swallowing.   Eyes: Negative for redness and visual disturbance.  Respiratory: Negative for shortness of breath and wheezing.   Cardiovascular: Negative for chest pain,  palpitations and leg swelling.  Gastrointestinal: Negative for abdominal pain, nausea and vomiting.       Negative for changes in bowel habits.  Endocrine: Negative for cold intolerance, heat intolerance, polydipsia, polyphagia and polyuria.  Genitourinary: Negative for decreased urine volume and hematuria.  Musculoskeletal: Negative for gait problem and myalgias.  Skin: Negative for pallor and rash.  Neurological: Positive for numbness. Negative for seizures, syncope, facial asymmetry, speech difficulty and weakness.  Psychiatric/Behavioral: Positive for sleep disturbance. Negative for confusion, hallucinations and suicidal ideas. The patient is nervous/anxious.      Current Outpatient Medications on File Prior to Visit  Medication Sig Dispense Refill  . levonorgestrel (MIRENA) 20 MCG/24HR IUD 1 each once by Intrauterine route.     No current facility-administered medications on file prior to visit.      Past Medical History:  Diagnosis Date  . Abnormal finding on Pap smear 01/2011   HSIL  . Chicken pox   . Depression   . History of blood transfusion 01/16/2016  . HSIL (high grade squamous intraepithelial lesion) on Pap smear 04/12/2011  . Postpartum care following vaginal delivery (08/04/13) 08/04/2013  . SVD (spontaneous vaginal delivery) 08/04/2013   Allergies  Allergen Reactions  . Erythromycin Nausea And Vomiting    Spikes high fever to 103    Social History   Socioeconomic History  . Marital status: Married    Spouse name: Not on file  . Number of children: 3  . Years of education: Not  on file  . Highest education level: Not on file  Occupational History  . Occupation: CMA  Social Needs  . Financial resource strain: Not on file  . Food insecurity:    Worry: Not on file    Inability: Not on file  . Transportation needs:    Medical: Not on file    Non-medical: Not on file  Tobacco Use  . Smoking status: Current Every Day Smoker    Packs/day: 0.50    Years:  22.00    Pack years: 11.00    Types: Cigarettes  . Smokeless tobacco: Never Used  . Tobacco comment: Tobacco info given 03/30/17  Substance and Sexual Activity  . Alcohol use: Yes    Alcohol/week: 0.6 - 1.8 oz    Types: 1 - 3 Glasses of wine per week  . Drug use: No    Comment: marijuana before preg. 4 years ago  . Sexual activity: Yes    Partners: Male    Birth control/protection: IUD    Comment: pregnant  Lifestyle  . Physical activity:    Days per week: Not on file    Minutes per session: Not on file  . Stress: Not on file  Relationships  . Social connections:    Talks on phone: Not on file    Gets together: Not on file    Attends religious service: Not on file    Active member of club or organization: Not on file    Attends meetings of clubs or organizations: Not on file    Relationship status: Not on file  Other Topics Concern  . Not on file  Social History Narrative  . Not on file    Vitals:   12/26/17 1623  BP: 120/70  Pulse: 94  Resp: 12  Temp: 98.4 F (36.9 C)  SpO2: 97%   Body mass index is 22.32 kg/m.    Physical Exam  Nursing note and vitals reviewed. Constitutional: She is oriented to person, place, and time. She appears well-developed and well-nourished. No distress.  HENT:  Head: Normocephalic and atraumatic.  Mouth/Throat: Oropharynx is clear and moist and mucous membranes are normal.  Eyes: Pupils are equal, round, and reactive to light. Conjunctivae are normal.  Cardiovascular: Normal rate and regular rhythm.  No murmur heard. Pulses:      Dorsalis pedis pulses are 2+ on the right side, and 2+ on the left side.  Respiratory: Effort normal and breath sounds normal. No respiratory distress.  GI: Soft. She exhibits no mass. There is no hepatomegaly. There is no tenderness.  Musculoskeletal: She exhibits no edema.  Neurological: She is alert and oriented to person, place, and time. She has normal strength. No cranial nerve deficit. Gait  normal.  Reflex Scores:      Patellar reflexes are 2+ on the right side and 2+ on the left side. Skin: Skin is warm. No rash noted. No erythema.  Psychiatric: She has a normal mood and affect.  Well groomed, good eye contact.     ASSESSMENT AND PLAN:   Ms. Alexandria Gutierrez was seen today for 3 months follow-up.  Orders Placed This Encounter  Procedures  . Vitamin B12  . VITAMIN D 25 Hydroxy (Vit-D Deficiency, Fractures)    Paresthesia  We discussed possible etiologies. She had one episode, thyroid function test and BMP were normal 3 to 4 months ago, so I do not think labs need to be repeated.  Today I will check B12 and 25 OH vitamin  D, further recommendation will be given according to lab results. Instructed about warning signs. If problem becomes recurrent we will need further work-up that may include brain imaging and/or neuro evaluation.   Depression, major, recurrent (HCC) Still not well controlled. Effexor will be increased from 37.5 mg to 75 mg daily. She will continue following with psychiatrist. Continue following with counselor.    Anxiety disorder, unspecified Continue following with counselor. Effexor dose adjusted. She will continue following with psychiatrist.           -Ms. Alexandria Gutierrez was advised to return sooner than planned today if new concerns arise.       Betty G. [place], MD  [facility]. [place] office.

## 2017-12-26 NOTE — Assessment & Plan Note (Signed)
Continue following with counselor. Effexor dose adjusted. She will continue following with psychiatrist.

## 2017-12-26 NOTE — Assessment & Plan Note (Signed)
Still not well controlled. Effexor will be increased from 37.5 mg to 75 mg daily. She will continue following with psychiatrist. Continue following with counselor.

## 2017-12-26 NOTE — Patient Instructions (Addendum)
A few things to remember from today's visit:   Moderate episode of recurrent major depressive disorder (HCC) - Plan: venlafaxine XR (EFFEXOR XR) 75 MG 24 hr capsule, VITAMIN D 25 Hydroxy (Vit-D Deficiency, Fractures)  Paresthesia - Plan: Vitamin B12, VITAMIN D 25 Hydroxy (Vit-D Deficiency, Fractures) Today Effexor was increased from 37.5mg  to 75 mg daily. Continue counselor and keep appointment with psychiatrist.  I think I can see you back in a year, before depending of lab results.   Please be sure medication list is accurate. If a new problem present, please set up appointment sooner than planned today.

## 2017-12-27 ENCOUNTER — Encounter: Payer: Self-pay | Admitting: Family Medicine

## 2017-12-27 LAB — VITAMIN D 25 HYDROXY (VIT D DEFICIENCY, FRACTURES): VITD: 23.95 ng/mL — ABNORMAL LOW (ref 30.00–100.00)

## 2017-12-27 LAB — VITAMIN B12: Vitamin B-12: 165 pg/mL — ABNORMAL LOW (ref 211–911)

## 2017-12-27 MED FILL — VENLAFAXINE HCL ER 75 MG CA: 75 | 30 days supply | Qty: 30 | Fill #0

## 2017-12-29 ENCOUNTER — Encounter: Payer: Self-pay | Admitting: Family Medicine

## 2017-12-30 ENCOUNTER — Other Ambulatory Visit: Payer: Self-pay | Admitting: Family Medicine

## 2017-12-30 DIAGNOSIS — E538 Deficiency of other specified B group vitamins: Secondary | ICD-10-CM | POA: Insufficient documentation

## 2017-12-30 MED ORDER — CYANOCOBALAMIN 1000 MCG/ML IJ SOLN
1000.0000 ug | INTRAMUSCULAR | 0 refills | Status: DC
Start: 2017-12-30 — End: 2018-01-04

## 2018-01-02 MED FILL — CYANOCOBALAMIN 1,000 MCG/ML: 1000 | 28 days supply | Qty: 4 | Fill #0

## 2018-01-04 ENCOUNTER — Other Ambulatory Visit: Payer: Self-pay | Admitting: Family Medicine

## 2018-01-04 DIAGNOSIS — E538 Deficiency of other specified B group vitamins: Secondary | ICD-10-CM

## 2018-01-04 MED ORDER — CYANOCOBALAMIN 1000 MCG/ML IJ SOLN: 1000.0000 ug | INTRAMUSCULAR | 0 refills | Status: AC

## 2018-01-05 ENCOUNTER — Telehealth: Payer: Self-pay | Admitting: Family Medicine

## 2018-01-05 NOTE — Telephone Encounter (Signed)
[facility] pharmacy called, need clarification on directions for Vitamin B 12 injections.

## 2018-01-05 NOTE — Telephone Encounter (Signed)
I called pharmacy and left a message to return phone call.

## 2018-01-06 MED ORDER — CYANOCOBALAMIN 1000 MCG/ML IJ SOLN: 1000.0000 ug | Freq: Every day | INTRAMUSCULAR | 0 refills | Status: AC

## 2018-01-06 MED FILL — CYANOCOBALAMIN 1,000 MCG/ML: 1000 | 28 days supply | Qty: 7 | Fill #0

## 2018-01-06 NOTE — Telephone Encounter (Signed)
Spoke to pharmacist and she stated that the pt was taking B12 differently from prescribed. Pt informed them that she was suppose to take B12 daily for a week then monthly for 4 months. Per Dr.jordans notes 12/27/2017, pt was correct however, the Rx was written that way. New Rx and clarification given to the pharmacist. No further action needed!

## 2018-01-20 DIAGNOSIS — F331 Major depressive disorder, recurrent, moderate: Secondary | ICD-10-CM | POA: Diagnosis not present

## 2018-01-24 MED FILL — FLUoxetine HCL 20 MG CAPS: 20 | 30 days supply | Qty: 30 | Fill #0

## 2018-01-25 ENCOUNTER — Encounter: Payer: Self-pay | Admitting: Family Medicine

## 2018-01-30 ENCOUNTER — Other Ambulatory Visit: Payer: Self-pay | Admitting: Family Medicine

## 2018-01-30 DIAGNOSIS — M25521 Pain in right elbow: Secondary | ICD-10-CM

## 2018-02-10 ENCOUNTER — Encounter: Payer: Self-pay | Admitting: Family Medicine

## 2018-03-09 ENCOUNTER — Ambulatory Visit (INDEPENDENT_AMBULATORY_CARE_PROVIDER_SITE_OTHER): Payer: 59 | Admitting: Physician Assistant

## 2018-03-09 ENCOUNTER — Encounter: Payer: Self-pay | Admitting: Physician Assistant

## 2018-03-09 DIAGNOSIS — F331 Major depressive disorder, recurrent, moderate: Secondary | ICD-10-CM | POA: Diagnosis not present

## 2018-03-09 DIAGNOSIS — F411 Generalized anxiety disorder: Secondary | ICD-10-CM

## 2018-03-09 MED ORDER — FLUOXETINE HCL 20 MG PO TABS: 20.0000 mg | ORAL_TABLET | Freq: Every day | ORAL | 0 refills | Status: DC

## 2018-03-09 MED FILL — FLUoxetine HCL 20 MG TABS: 20 | 90 days supply | Qty: 90 | Fill #0

## 2018-03-09 NOTE — Progress Notes (Signed)
[facility] Check  Patient ID: Alexandria Gutierrez,  MRN: [id number]  PCP: Gutierrez, Alexandria G, MD  Date of Evaluation: 03/09/2018 Time spent:15 minutes   HISTORY/CURRENT STATUS: HPI patient presents today for a 24-month med check after starting Prozac.  The only problem she has had with it is a little nausea that goes away quickly after she takes the medication in the morning.  As far as whether the medicine is helping her or not, she is unsure of.  She feels well.  Is able to enjoy things.  Energy and motivation are good.  She is not isolating.  No easy crying.  Denies suicidal or homicidal thoughts. She denies any drug use since March 2009.  She does continue to smoke and does not wish to quit at this point. The anxiety is well controlled.  Past medications for mental health diagnoses include: Wellbutrin, Effexor, Abilify, Zoloft, Depakote, and possibly Cymbalta.  Individual Medical History/ Review of Systems: Changes? :No  Allergies: Erythromycin  Current Medications:  Current Outpatient Medications:  .  cyanocobalamin (,VITAMIN B-12,) 1000 MCG/ML injection, Inject 1 mL (1,000 mcg total) into the muscle every 30 (thirty) days for 4 doses., Disp: 4 mL, Rfl: 0 .  FLUoxetine (PROZAC) 20 MG tablet, Take 1 tablet (20 mg total) by mouth daily., Disp: 90 tablet, Rfl: 0 .  levonorgestrel (MIRENA) 20 MCG/24HR IUD, 1 each once by Intrauterine route., Disp: , Rfl:  Medication Side Effects: Nausea  Family Medical/ Social History: Changes? No  MENTAL HEALTH EXAM:  There were no vitals taken for this visit.There is no height or weight on file to calculate BMI.  General Appearance: Well Groomed  Eye Contact:  Good  Speech:  Clear and Coherent  Volume:  Normal  Mood:  Euthymic  Affect:  Appropriate  Thought Process:  Goal Directed  Orientation:  Full (Time, Place, and Person)  Thought Content: Logical   Suicidal Thoughts:  No  Homicidal Thoughts:  No  Memory:  Immediate   Judgement:  Good  Insight:  Good  Psychomotor Activity:  Normal  Concentration:  Concentration: Good  Recall:  Good  Fund of Knowledge: Good  Language: Good  Akathisia:  No  AIMS (if indicated): not done  Assets:  Desire for Improvement  ADL's:  Intact  Cognition: WNL  Prognosis:  Good    DIAGNOSES:    ICD-10-CM   1. Major depressive disorder, recurrent episode, moderate (HCC) F33.1   2. Generalized anxiety disorder F41.1   3.      Suicide attempt x 2 w/ OD of Tylenol PM, Lysol  RECOMMENDATIONS: Since Alexandria Gutierrez seems to be doing well at this point we will leave everything the same.  She will change the Prozac from morning to evening dosing see if that will help with the nausea she gets. Return in 8 weeks or sooner as needed.    Alexandria Overly, PA-C

## 2018-05-11 ENCOUNTER — Ambulatory Visit: Payer: 59 | Admitting: Physician Assistant

## 2018-05-11 ENCOUNTER — Encounter: Payer: Self-pay | Admitting: Physician Assistant

## 2018-05-11 ENCOUNTER — Ambulatory Visit (INDEPENDENT_AMBULATORY_CARE_PROVIDER_SITE_OTHER): Payer: 59 | Admitting: Physician Assistant

## 2018-05-11 DIAGNOSIS — F331 Major depressive disorder, recurrent, moderate: Secondary | ICD-10-CM

## 2018-05-11 DIAGNOSIS — F411 Generalized anxiety disorder: Secondary | ICD-10-CM | POA: Diagnosis not present

## 2018-05-11 NOTE — Progress Notes (Signed)
[facility]  Patient ID: Alexandria SorrowMeagen Shine Trahan,  MRN: [id number]  PCP: [place], Betty G, MD  Date of Evaluation: 05/11/2018 Time spent:15 minutes  Chief Complaint:  Chief Complaint    Follow-up      HISTORY/CURRENT STATUS: HPI here for routine med check.  Patient denies loss of interest in usual activities and is able to enjoy things.  Denies decreased energy or motivation.  Appetite has not changed.  No extreme sadness, tearfulness, or feelings of hopelessness.  Denies any changes in concentration, making decisions or remembering things.  Denies suicidal or homicidal thoughts. She still not sure how much the Prozac is helping but she thinks it must be doing something because she is been more interested in her crafts and is excited about Christmas this year.  Her husband has noticed a change for the good as well.  States she does get anxious sometimes but not as much.  Individual Medical History/ Review of Systems: Changes? :No    Past medications for mental health diagnoses include: Wellbutrin, Effexor, Abilify, Zoloft, Depakote, and possibly Cymbalta.  Allergies: Erythromycin  Current Medications:  Current Outpatient Medications:  .  FLUoxetine (PROZAC) 20 MG tablet, Take 1 tablet (20 mg total) by mouth daily., Disp: 90 tablet, Rfl: 0 .  levonorgestrel (MIRENA) 20 MCG/24HR IUD, 1 each once by Intrauterine route., Disp: , Rfl:  Medication Side Effects: nausea  Family Medical/ Social History: Changes? No  MENTAL HEALTH EXAM:  There were no vitals taken for this visit.There is no height or weight on file to calculate BMI.  General Appearance: Casual and Well Groomed  Eye Contact:  Good  Speech:  Clear and Coherent  Volume:  Normal  Mood:  Euthymic  Affect:  Appropriate  Thought Process:  Goal Directed  Orientation:  Full (Time, Place, and Person)  Thought Content: Logical   Suicidal Thoughts:  No  Homicidal Thoughts:  No  Memory:  WNL  Judgement:  Good   Insight:  Good  Psychomotor Activity:  Normal  Concentration:  Concentration: Good  Recall:  Good  Fund of Knowledge: Good  Language: Good  Assets:  Desire for Improvement  ADL's:  Intact  Cognition: WNL  Prognosis:  Good    DIAGNOSES:    ICD-10-CM   1. Major depressive disorder, recurrent episode, moderate (HCC) F33.1   2. Generalized anxiety disorder F41.1     Receiving Psychotherapy: No    RECOMMENDATIONS: She does seem to be doing better so we will keep her on the Prozac at the current dose. Exercise and diet were discussed. Return in 3 months or sooner as needed.   Melony Overlyeresa Lenzie Sandler, PA-C

## 2018-05-24 NOTE — Progress Notes (Signed)
[name] [address] [place], [place] [address] Phone: [phone] Subjective:   Bruce Donath, am serving as a scribe for Dr. Antoine Primas.  I'm seeing this patient by the request  of:  [place], Betty G, MD   CC: Wrist pain  PHX:[id number]  Alexandria Gutierrez is a 38 y.o. female coming in with complaint of right arm pain. In the morning she experiences numbness in entire arm. Does do a lot of handwriting and rests arm on desk and has elbow flexed as she writes. Has sharp pain that radiates from elbow to shoulder. Has recently started B12 injections. Has history of ganglion cyst, age 8. Cyst was removed surgically.       Past Medical History:  Diagnosis Date  . Abnormal finding on Pap smear 01/2011   HSIL  . Chicken pox   . Depression   . History of blood transfusion 01/16/2016  . HSIL (high grade squamous intraepithelial lesion) on Pap smear 04/12/2011  . Postpartum care following vaginal delivery (08/04/13) 08/04/2013  . SVD (spontaneous vaginal delivery) 08/04/2013   Past Surgical History:  Procedure Laterality Date  . CESAREAN SECTION N/A 01/16/2016   Procedure: CESAREAN SECTION;  Surgeon: Myna Hidalgo, DO;  Location: [facility];  Service: Obstetrics;  Laterality: N/A;  . CYSTECTOMY Right 26 or 38 years old   on wrist  . WISDOM TOOTH EXTRACTION     Social History   Socioeconomic History  . Marital status: Married    Spouse name: Not on file  . Number of children: 3  . Years of education: Not on file  . Highest education level: Not on file  Occupational History  . Occupation: CMA  Social Needs  . Financial resource strain: Not on file  . Food insecurity:    Worry: Not on file    Inability: Not on file  . Transportation needs:    Medical: Not on file    Non-medical: Not on file  Tobacco Use  . Smoking status: Current Every Day Smoker    Packs/day: 0.25    Years: 22.00    Pack years: 5.50    Types: Cigarettes  .  Smokeless tobacco: Never Used  . Tobacco comment: Tobacco info given 03/30/17  Substance and Sexual Activity  . Alcohol use: Yes    Alcohol/week: 2.0 standard drinks    Types: 2 Glasses of wine per week    Comment: per month  . Drug use: Yes    Types: Marijuana    Comment: occas  . Sexual activity: Yes    Partners: Male    Birth control/protection: I.U.D.    Comment: pregnant  Lifestyle  . Physical activity:    Days per week: Not on file    Minutes per session: Not on file  . Stress: Not on file  Relationships  . Social connections:    Talks on phone: Not on file    Gets together: Not on file    Attends religious service: Not on file    Active member of club or organization: Not on file    Attends meetings of clubs or organizations: Not on file    Relationship status: Not on file  Other Topics Concern  . Not on file  Social History Narrative  . Not on file   Allergies  Allergen Reactions  . Erythromycin Nausea And Vomiting    Spikes high fever to 103   Family History  Problem Relation Age of Onset  .  Alcohol abuse Maternal Aunt   . Mental illness Maternal Aunt   . Endometriosis Mother   . Heart disease Maternal Grandmother   . Heart disease Maternal Grandfather   . Stroke Paternal Grandfather   . Colon cancer Neg Hx   . Esophageal cancer Neg Hx   . Rectal cancer Neg Hx     Current Outpatient Medications (Endocrine & Metabolic):  .  levonorgestrel (MIRENA) 20 MCG/24HR IUD, 1 each once by Intrauterine route.      Current Outpatient Medications (Other):  Marland Kitchen  FLUoxetine (PROZAC) 20 MG tablet, Take 1 tablet (20 mg total) by mouth daily.    Past medical history, social, surgical and family history all reviewed in electronic medical record.  No pertanent information unless stated regarding to the chief complaint.   Review of Systems:  No headache, visual changes, nausea, vomiting, diarrhea, constipation, dizziness, abdominal pain, skin rash, fevers, chills,  night sweats, weight loss, swollen lymph nodes, body aches, joint swelling, muscle aches, chest pain, shortness of breath, mood changes.   Objective  Blood pressure 102/64, pulse 82, height 5\' 3"  (1.6 m), weight 113 lb (51.3 kg), SpO2 96 %.   General: No apparent distress alert and oriented x3 mood and affect normal, dressed appropriately.  HEENT: Pupils equal, extraocular movements intact  Respiratory: Patient's speak in full sentences and does not appear short of breath  Cardiovascular: No lower extremity edema, non tender, no erythema  Skin: Warm dry intact with no signs of infection or rash on extremities or on axial skeleton.  Abdomen: Soft nontender  Neuro: Cranial nerves II through XII are intact, neurovascularly intact in all extremities with 2+ DTRs and 2+ pulses.  Lymph: No lymphadenopathy of posterior or anterior cervical chain or axillae bilaterally.  Gait normal with good balance and coordination.  MSK:  Non tender with full range of motion and good stability and symmetric strength and tone of shoulders, ,  hip, knee and ankles bilaterally.  Wrist: Right Inspection normal with no visible erythema or swelling.  Mild radicular symptoms noted on the dorsal aspect of the wrist ROM smooth and normal with good flexion and extension and ulnar/radial deviation that is symmetrical with opposite wrist. Palpation is normal over metacarpals, navicular, lunate, and TFCC; tendons without tenderness/ swelling No snuffbox tenderness. No tenderness over Canal of Guyon. Strength 5/5 in all directions without pain. Negative Finkelstein, tinel's and phalens. Negative Watson's test. Contralateral wrist unremarkable  Elbow:right  Unremarkable to inspection. Tender over the ulnar aspect of the elbow more.  Cubital fossa.  Patient does have positive Tinel's.  Ulnar subluxation noted. Positive cubital tunnel Tinel's.  Patient does have weakness in the C8 distribution on the right side compared to  the left  Limited musculoskeletal ultrasound was performed and interpreted by Judi Saa  Limited ultrasound of patient's right elbow shows that patient does have what appears to be a chronic subluxation of the ulnar nerve with some thickening of the sheath.  Mild swelling noted in the cubital fossa Impression: Chronic ulnar nerve subluxation  97110; 15 additional minutes spent for Therapeutic exercises as stated in above notes.  This included exercises focusing on stretching, strengthening, with significant focus on eccentric aspects.   Long term goals include an improvement in range of motion, strength, endurance as well as avoiding reinjury. Patient's frequency would include in 1-2 times a day, 3-5 times a week for a duration of 6-12 weeks. . Proper technique shown and discussed handout in great detail with ATC.  All questions were discussed and answered.      Impression and Recommendations:     This case required medical decision making of moderate complexity. The above documentation has been reviewed and is accurate and complete Lyndal Pulley, DO       Note: This dictation was prepared with Dragon dictation along with smaller phrase technology. Any transcriptional errors that result from this process are unintentional.

## 2018-05-25 ENCOUNTER — Ambulatory Visit: Payer: No Typology Code available for payment source | Admitting: Family Medicine

## 2018-05-25 ENCOUNTER — Ambulatory Visit: Payer: Self-pay

## 2018-05-25 ENCOUNTER — Encounter: Payer: Self-pay | Admitting: Family Medicine

## 2018-05-25 VITALS — BP 102/64 | HR 82 | Ht 63.0 in | Wt 113.0 lb

## 2018-05-25 DIAGNOSIS — M79601 Pain in right arm: Secondary | ICD-10-CM | POA: Diagnosis not present

## 2018-05-25 NOTE — Patient Instructions (Signed)
Good to see you  I think you did injure the elbow as well but should do well  B6 100mg  daily to help absorption of B12  Ice 5 minutes 2 times a day  Compression sleeve with a pad maybe daily at first for a week then only when resting the elbow or a lot of activity  Vitamin D 2000 IU daily over the counter as well  See me again in 4-6 weeks to make sure you are doing better  Happy New Year!

## 2018-06-29 ENCOUNTER — Encounter: Payer: Self-pay | Admitting: Family Medicine

## 2018-06-29 ENCOUNTER — Ambulatory Visit: Payer: Self-pay | Admitting: Family Medicine

## 2018-06-29 ENCOUNTER — Ambulatory Visit (INDEPENDENT_AMBULATORY_CARE_PROVIDER_SITE_OTHER): Payer: Self-pay | Admitting: Family Medicine

## 2018-06-29 VITALS — BP 100/60 | HR 94 | Temp 99.1°F | Resp 12 | Wt 113.4 lb

## 2018-06-29 DIAGNOSIS — R059 Cough, unspecified: Secondary | ICD-10-CM

## 2018-06-29 DIAGNOSIS — R05 Cough: Secondary | ICD-10-CM

## 2018-06-29 DIAGNOSIS — J029 Acute pharyngitis, unspecified: Secondary | ICD-10-CM

## 2018-06-29 DIAGNOSIS — R0981 Nasal congestion: Secondary | ICD-10-CM

## 2018-06-29 DIAGNOSIS — R6889 Other general symptoms and signs: Secondary | ICD-10-CM

## 2018-06-29 LAB — POCT INFLUENZA A/B
Influenza A, POC: NEGATIVE
Influenza B, POC: NEGATIVE

## 2018-06-29 LAB — POCT RAPID STREP A (OFFICE): Rapid Strep A Screen: NEGATIVE

## 2018-06-29 MED ORDER — AZELASTINE HCL 0.1 % NA SOLN: 1.0000 | Freq: Two times a day (BID) | NASAL | 0 refills | Status: DC

## 2018-06-29 MED ORDER — OSELTAMIVIR PHOSPHATE 75 MG PO CAPS: 75.0000 mg | ORAL_CAPSULE | Freq: Two times a day (BID) | ORAL | 0 refills | Status: AC

## 2018-06-29 MED ORDER — DM-GUAIFENESIN ER 30-600 MG PO TB12: 1.0000 | ORAL_TABLET | Freq: Two times a day (BID) | ORAL | 0 refills | Status: DC | PRN

## 2018-06-29 NOTE — Progress Notes (Signed)
Alexandria Gutierrez is a 38 y.o. female who presents today with 2 days of flu like symptoms with worsening today to with what patient describes as intense fatigue, and running a fever. She has not used anything over the counter today for her symptoms. She is a smoker but denies seasonal allergies or any recent illness. She has 3 school age children at home who are reportedly well at this time and works in healthcare with a geriatric population. She cannot pinpoint one specific exposure to any particular illness she reports. See history below.     Review of Systems  Constitutional: Positive for chills, fever and malaise/fatigue.  HENT: Negative for congestion, ear discharge, ear pain, sinus pain and sore throat.   Eyes: Negative.   Respiratory: Positive for cough. Negative for sputum production and shortness of breath.   Cardiovascular: Negative.  Negative for chest pain.  Gastrointestinal: Negative for abdominal pain, diarrhea, nausea and vomiting.  Genitourinary: Negative for dysuria, frequency, hematuria and urgency.  Musculoskeletal: Positive for myalgias.  Skin: Negative.   Neurological: Negative for headaches.  Endo/Heme/Allergies: Negative.   Psychiatric/Behavioral: Negative.     Alexandria Gutierrez has a current medication list which includes the following prescription(s): fluoxetine and levonorgestrel. Also is allergic to erythromycin.  Alexandria Gutierrez  has a past medical history of Abnormal finding on Pap smear (01/2011), Chicken pox, Depression, History of blood transfusion (01/16/2016), HSIL (high grade squamous intraepithelial lesion) on Pap smear (04/12/2011), Postpartum care following vaginal delivery (08/04/13) (08/04/2013), and SVD (spontaneous vaginal delivery) (08/04/2013). Also  has a past surgical history that includes Wisdom tooth extraction; Cystectomy (Right, 76 or 38 years old); and Cesarean section (N/A, 01/16/2016).    O: Vitals:   06/29/18 1717  BP: 100/60  Pulse: 94  Resp: 12  Temp:  99.1 F (37.3 C)  SpO2: 96%     Physical Exam Vitals signs (low grade) reviewed.  Constitutional:      General: She is not in acute distress.    Appearance: Normal appearance. She is well-developed. She is not ill-appearing, toxic-appearing or diaphoretic.  HENT:     Head: Normocephalic.     Right Ear: Hearing, tympanic membrane, ear canal and external ear normal. Tympanic membrane is not injected.     Left Ear: Hearing, tympanic membrane, ear canal and external ear normal. Tympanic membrane is not injected.     Nose: Congestion and rhinorrhea present.     Right Sinus: No maxillary sinus tenderness or frontal sinus tenderness.     Left Sinus: No maxillary sinus tenderness or frontal sinus tenderness.     Mouth/Throat:     Mouth: Mucous membranes are moist.     Pharynx: Uvula midline. Oropharyngeal exudate and posterior oropharyngeal erythema present. No pharyngeal swelling or uvula swelling.     Tonsils: Tonsillar exudate present. No tonsillar abscesses. Swelling: 3+ on the right. 3+ on the left.  Eyes:     Pupils: Pupils are equal, round, and reactive to light.  Neck:     Musculoskeletal: Normal range of motion and neck supple.  Cardiovascular:     Rate and Rhythm: Normal rate and regular rhythm.     Pulses: Normal pulses.     Heart sounds: Normal heart sounds.  Pulmonary:     Effort: Pulmonary effort is normal.     Breath sounds: Normal breath sounds. No decreased breath sounds, wheezing, rhonchi or rales.  Abdominal:     General: Bowel sounds are normal.     Palpations: Abdomen is soft.  Musculoskeletal:  Normal range of motion.  Lymphadenopathy:     Head:     Right side of head: No submental, submandibular or tonsillar adenopathy.     Left side of head: No submental, submandibular or tonsillar adenopathy.     Cervical: No cervical adenopathy.     Right cervical: No superficial cervical adenopathy.    Left cervical: No superficial cervical adenopathy.  Neurological:      Mental Status: She is alert and oriented to person, place, and time.  Psychiatric:        Mood and Affect: Mood normal.        Behavior: Behavior is cooperative.    A: 1. Flu-like symptoms   2. Cough   3. Sore throat    P: 1. Flu-like symptoms Discussed ED precautions, flu complications and reasons to follow up. Will provide antiviral as well as supportive care patient does not appear toxic today and should recover well with treatment based on physical exam today. VSS and patient in NAD> Work note x 48 hours provided. - POCT Influenza A/B - POCT rapid strep A - oseltamivir (TAMIFLU) 75 MG capsule; Take 1 capsule (75 mg total) by mouth 2 (two) times daily for 5 days. Results for orders placed or performed in visit on 06/29/18 (from the past 24 hour(s))  POCT Influenza A/B     Status: None   Collection Time: 06/29/18  5:37 PM  Result Value Ref Range   Influenza A, POC Negative Negative   Influenza B, POC Negative Negative  POCT rapid strep A     Status: None   Collection Time: 06/29/18  5:53 PM  Result Value Ref Range   Rapid Strep A Screen Negative Negative   2. Cough - dextromethorphan-guaiFENesin (MUCINEX DM) 30-600 MG 12hr tablet; Take 1 tablet by mouth 2 (two) times daily as needed for cough.  3. Sore throat Supportive care-   4. Nasal congestion Supportive care- - azelastine (ASTELIN) 0.1 % nasal spray; Place 1 spray into both nostrils 2 (two) times daily. Use in each nostril as directed   Discussed with patient exam findings, suspected diagnosis etiology and  reviewed recommended treatment plan and follow up, including complications and indications for urgent medical follow up and evaluation. Medications including use and indications reviewed with patient. Patient provided relevant patient education on diagnosis and/or relevant related condition that were discussed and reviewed with patient at discharge. Patient verbalized understanding of information provided and agrees  with plan of care (POC), all questions answered.

## 2018-06-29 NOTE — Progress Notes (Deleted)
[name] [facility] [address] [place], [place] [address] Phone: [phone] Subjective:    I'm seeing this patient by the request  of:    CC:   BJY:[id number]:Subjective  Alexandria Gutierrez Alexandria Gutierrez is a 38 y.o. female coming in with complaint of ***  Onset-  Location Duration-  Character- Aggravating factors- Reliving factors-  Therapies tried-  Severity-     Past Medical History:  Diagnosis Date  . Abnormal finding on Pap smear 01/2011   HSIL  . Chicken pox   . Depression   . History of blood transfusion 01/16/2016  . HSIL (high grade squamous intraepithelial lesion) on Pap smear 04/12/2011  . Postpartum care following vaginal delivery (08/04/13) 08/04/2013  . SVD (spontaneous vaginal delivery) 08/04/2013   Past Surgical History:  Procedure Laterality Date  . CESAREAN SECTION N/A 01/16/2016   Procedure: CESAREAN SECTION;  Surgeon: Myna HidalgoJennifer Ozan, DO;  Location: [facility];  Service: Obstetrics;  Laterality: N/A;  . CYSTECTOMY Right 89 or 38 years old   on wrist  . WISDOM TOOTH EXTRACTION     Social History   Socioeconomic History  . Marital status: Married    Spouse name: Not on file  . Number of children: 3  . Years of education: Not on file  . Highest education level: Not on file  Occupational History  . Occupation: CMA  Social Needs  . Financial resource strain: Not on file  . Food insecurity:    Worry: Not on file    Inability: Not on file  . Transportation needs:    Medical: Not on file    Non-medical: Not on file  Tobacco Use  . Smoking status: Current Every Day Smoker    Packs/day: 0.25    Years: 22.00    Pack years: 5.50    Types: Cigarettes  . Smokeless tobacco: Never Used  . Tobacco comment: Tobacco info given 03/30/17  Substance and Sexual Activity  . Alcohol use: Yes    Alcohol/week: 2.0 standard drinks    Types: 2 Glasses of wine per week    Comment: per month  . Drug use: Yes    Types: Marijuana    Comment: occas  .  Sexual activity: Yes    Partners: Male    Birth control/protection: I.U.D.    Comment: pregnant  Lifestyle  . Physical activity:    Days per week: Not on file    Minutes per session: Not on file  . Stress: Not on file  Relationships  . Social connections:    Talks on phone: Not on file    Gets together: Not on file    Attends religious service: Not on file    Active member of club or organization: Not on file    Attends meetings of clubs or organizations: Not on file    Relationship status: Not on file  Other Topics Concern  . Not on file  Social History Narrative  . Not on file   Allergies  Allergen Reactions  . Erythromycin Nausea And Vomiting    Spikes high fever to 103   Family History  Problem Relation Age of Onset  . Alcohol abuse Maternal Aunt   . Mental illness Maternal Aunt   . Endometriosis Mother   . Heart disease Maternal Grandmother   . Heart disease Maternal Grandfather   . Stroke Paternal Grandfather   . Colon cancer Neg Hx   . Esophageal cancer Neg Hx   . Rectal cancer  Neg Hx     Current Outpatient Medications (Endocrine & Metabolic):  .  levonorgestrel (MIRENA) 20 MCG/24HR IUD, 1 each once by Intrauterine route.      Current Outpatient Medications (Other):  Marland Kitchen  FLUoxetine (PROZAC) 20 MG tablet, Take 1 tablet (20 mg total) by mouth daily.    Past medical history, social, surgical and family history all reviewed in electronic medical record.  No pertanent information unless stated regarding to the chief complaint.   Review of Systems:  No headache, visual changes, nausea, vomiting, diarrhea, constipation, dizziness, abdominal pain, skin rash, fevers, chills, night sweats, weight loss, swollen lymph nodes, body aches, joint swelling, muscle aches, chest pain, shortness of breath, mood changes.   Objective  There were no vitals taken for this visit. Systems examined below as of    General: No apparent distress alert and oriented x3 mood and  affect normal, dressed appropriately.  HEENT: Pupils equal, extraocular movements intact  Respiratory: Patient's speak in full sentences and does not appear short of breath  Cardiovascular: No lower extremity edema, non tender, no erythema  Skin: Warm dry intact with no signs of infection or rash on extremities or on axial skeleton.  Abdomen: Soft nontender  Neuro: Cranial nerves II through XII are intact, neurovascularly intact in all extremities with 2+ DTRs and 2+ pulses.  Lymph: No lymphadenopathy of posterior or anterior cervical chain or axillae bilaterally.  Gait normal with good balance and coordination.  MSK:  Non tender with full range of motion and good stability and symmetric strength and tone of shoulders, elbows, wrist, hip, knee and ankles bilaterally.     Impression and Recommendations:     This case required medical decision making of moderate complexity. The above documentation has been reviewed and is accurate and complete Judi Saa, DO       Note: This dictation was prepared with Dragon dictation along with smaller phrase technology. Any transcriptional errors that result from this process are unintentional.

## 2018-06-29 NOTE — Patient Instructions (Signed)

## 2018-07-13 ENCOUNTER — Ambulatory Visit (INDEPENDENT_AMBULATORY_CARE_PROVIDER_SITE_OTHER): Payer: No Typology Code available for payment source | Admitting: Family Medicine

## 2018-07-13 ENCOUNTER — Encounter: Payer: Self-pay | Admitting: Family Medicine

## 2018-07-13 DIAGNOSIS — M357 Hypermobility syndrome: Secondary | ICD-10-CM | POA: Diagnosis not present

## 2018-07-13 DIAGNOSIS — M25521 Pain in right elbow: Secondary | ICD-10-CM

## 2018-07-13 NOTE — Progress Notes (Signed)
[name] [address] [place], [place] [address] Phone: [phone] Subjective:    I Ronelle Nigh am serving as a Neurosurgeon for Dr. Antoine Primas.   CC: Right arm pain follow-up  OLI:[id number]    05/25/2018 Alexandria Gutierrez is a 38 y.o. female coming in with complaint of right arm pain. States that she is feeling a lot better. Still tingling in the finger tips and shooting pain up the arm.  States that when it is very intermittent.  Not having as much pain at all at the moment.  Patient is making progress patient states.     Past Medical History:  Diagnosis Date  . Abnormal finding on Pap smear 01/2011   HSIL  . Chicken pox   . Depression   . History of blood transfusion 01/16/2016  . HSIL (high grade squamous intraepithelial lesion) on Pap smear 04/12/2011  . Postpartum care following vaginal delivery (08/04/13) 08/04/2013  . SVD (spontaneous vaginal delivery) 08/04/2013   Past Surgical History:  Procedure Laterality Date  . CESAREAN SECTION N/A 01/16/2016   Procedure: CESAREAN SECTION;  Surgeon: Myna Hidalgo, DO;  Location: [facility];  Service: Obstetrics;  Laterality: N/A;  . CYSTECTOMY Right 80 or 38 years old   on wrist  . WISDOM TOOTH EXTRACTION     Social History   Socioeconomic History  . Marital status: Married    Spouse name: Not on file  . Number of children: 3  . Years of education: Not on file  . Highest education level: Not on file  Occupational History  . Occupation: CMA  Social Needs  . Financial resource strain: Not on file  . Food insecurity:    Worry: Not on file    Inability: Not on file  . Transportation needs:    Medical: Not on file    Non-medical: Not on file  Tobacco Use  . Smoking status: Current Every Day Smoker    Packs/day: 0.25    Years: 22.00    Pack years: 5.50    Types: Cigarettes  . Smokeless tobacco: Never Used  . Tobacco comment: Tobacco info given 03/30/17  Substance and Sexual  Activity  . Alcohol use: Yes    Alcohol/week: 2.0 standard drinks    Types: 2 Glasses of wine per week    Comment: per month  . Drug use: Yes    Types: Marijuana    Comment: occas  . Sexual activity: Yes    Partners: Male    Birth control/protection: I.U.D.    Comment: pregnant  Lifestyle  . Physical activity:    Days per week: Not on file    Minutes per session: Not on file  . Stress: Not on file  Relationships  . Social connections:    Talks on phone: Not on file    Gets together: Not on file    Attends religious service: Not on file    Active member of club or organization: Not on file    Attends meetings of clubs or organizations: Not on file    Relationship status: Not on file  Other Topics Concern  . Not on file  Social History Narrative  . Not on file   Allergies  Allergen Reactions  . Erythromycin Nausea And Vomiting    Spikes high fever to 103   Family History  Problem Relation Age of Onset  . Alcohol abuse Maternal Aunt   . Mental illness Maternal Aunt   . Endometriosis  Mother   . Heart disease Maternal Grandmother   . Heart disease Maternal Grandfather   . Stroke Paternal Grandfather   . Colon cancer Neg Hx   . Esophageal cancer Neg Hx   . Rectal cancer Neg Hx     Current Outpatient Medications (Endocrine & Metabolic):  .  levonorgestrel (MIRENA) 20 MCG/24HR IUD, 1 each once by Intrauterine route.   Current Outpatient Medications (Respiratory):  .  azelastine (ASTELIN) 0.1 % nasal spray, Place 1 spray into both nostrils 2 (two) times daily. Use in each nostril as directed .  dextromethorphan-guaiFENesin (MUCINEX DM) 30-600 MG 12hr tablet, Take 1 tablet by mouth 2 (two) times daily as needed for cough.    Current Outpatient Medications (Other):  Marland Kitchen  FLUoxetine (PROZAC) 20 MG tablet, Take 1 tablet (20 mg total) by mouth daily.    Past medical history, social, surgical and family history all reviewed in electronic medical record.  No pertanent  information unless stated regarding to the chief complaint.   Review of Systems:  No headache, visual changes, nausea, vomiting, diarrhea, constipation, dizziness, abdominal pain, skin rash, fevers, chills, night sweats, weight loss, swollen lymph nodes, body aches, joint swelling, muscle aches, chest pain, shortness of breath, mood changes.   Objective  Blood pressure (!) 90/50, pulse 76, height 5\' 3"  (1.6 m), weight 112 lb (50.8 kg), SpO2 98 %.    General: No apparent distress alert and oriented x3 mood and affect normal, dressed appropriately.  HEENT: Pupils equal, extraocular movements intact  Respiratory: Patient's speak in full sentences and does not appear short of breath  Cardiovascular: No lower extremity edema, non tender, no erythema  Skin: Warm dry intact with no signs of infection or rash on extremities or on axial skeleton.  Abdomen: Soft nontender  Neuro: Cranial nerves II through XII are intact, neurovascularly intact in all extremities with 2+ DTRs and 2+ pulses.  Lymph: No lymphadenopathy of posterior or anterior cervical chain or axillae bilaterally.  Gait normal with good balance and coordination.  MSK:  Non tender with full range of motion and good stability and symmetric strength and tone of shoulders, wrist, hip, knee and ankles bilaterally.  Hypermobility noted to multiple joints  Right elbow exam reveals full range of motion and actually hyperextension.  Patient is minimally tender over the cubital tunnel today.  Patient has good grip strength.  No weakness of the hand.    Impression and Recommendations:     This case required medical decision making of moderate complexity. The above documentation has been reviewed and is accurate and complete Judi Saa, DO       Note: This dictation was prepared with Dragon dictation along with smaller phrase technology. Any transcriptional errors that result from this process are unintentional.

## 2018-07-13 NOTE — Patient Instructions (Signed)
God to see you  Alexandria Gutierrez is your friend when needed Still consider the compression sleeve See me when you need me  [phone]

## 2018-07-13 NOTE — Assessment & Plan Note (Signed)
Right elbow pain that seem to be more of a ulnar subluxation initially because of patient's hypermobility syndrome.  Doing better at this time even though patient has been fairly noncompliant.  Discussed icing regimen and home exercise.  Discussed which activities to doing which wants to avoid.  Follow-up with me again as needed as long as patient continues to improve

## 2018-08-10 ENCOUNTER — Ambulatory Visit: Payer: 59 | Admitting: Physician Assistant

## 2018-08-31 ENCOUNTER — Ambulatory Visit (INDEPENDENT_AMBULATORY_CARE_PROVIDER_SITE_OTHER): Payer: No Typology Code available for payment source | Admitting: Physician Assistant

## 2018-08-31 ENCOUNTER — Other Ambulatory Visit: Payer: Self-pay

## 2018-08-31 ENCOUNTER — Encounter: Payer: Self-pay | Admitting: Physician Assistant

## 2018-08-31 DIAGNOSIS — F411 Generalized anxiety disorder: Secondary | ICD-10-CM

## 2018-08-31 DIAGNOSIS — Z87898 Personal history of other specified conditions: Secondary | ICD-10-CM | POA: Diagnosis not present

## 2018-08-31 DIAGNOSIS — F1991 Other psychoactive substance use, unspecified, in remission: Secondary | ICD-10-CM

## 2018-08-31 MED ORDER — ESCITALOPRAM OXALATE 10 MG PO TABS: 10.0000 mg | ORAL_TABLET | Freq: Every day | ORAL | 1 refills | Status: DC

## 2018-08-31 MED ORDER — HYDROXYZINE HCL 10 MG PO TABS: 10.0000 mg | ORAL_TABLET | Freq: Four times a day (QID) | ORAL | 1 refills | Status: DC | PRN

## 2018-08-31 MED FILL — hydrOXYzine HCL 10 MG TABS: 10 | 8 days supply | Qty: 60 | Fill #0

## 2018-08-31 MED FILL — ESCITALOPRAM 10 MG TABLET: 10 | 30 days supply | Qty: 30 | Fill #0

## 2018-08-31 NOTE — Progress Notes (Signed)
[facility]  Patient ID: Alexandria Gutierrez,  MRN: [id number]  PCP: [place], Betty G, MD  Date of Evaluation: 08/31/2018 Time spent:15 minutes  Chief Complaint:  Chief Complaint    Follow-up     Virtual Visit via Telephone Note  I connected with Alexandria Gutierrez on 08/31/18 at  1:30 PM EDT by telephone and verified that I am speaking with the correct person using two identifiers.   I discussed the limitations, risks, security and privacy concerns of performing an evaluation and management service by telephone and the availability of in person appointments. I also discussed with the patient that there may be a patient responsible charge related to this service. The patient expressed understanding and agreed to proceed.   HISTORY/CURRENT STATUS: HPI For routine med check.  She d/c the Prozac about a month ago. Didn't feel like it was doing anything except making her a little more irritable.  She felt like she was doing well, until March 14th, when everything changed for her family. 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  She and her husband are working opposite shifts now and they don't see each other much.  She's anxious all the time.  "I feel like a ball of nerves all the time. I'm not generally an anxious person, but with all this."  She works at [employer] and it's stressful.   Patient denies loss of interest in usual activities and is able to enjoy things.  Denies decreased energy or motivation.  Appetite has not changed.  No extreme sadness, tearfulness, or feelings of hopelessness.  Denies any changes in concentration, making decisions or remembering things.  Denies suicidal or homicidal thoughts.  Patient denies increased energy with decreased need for sleep, no increased talkativeness, no racing thoughts, no impulsivity or risky behaviors, no increased spending, no increased libido, no grandiosity.  Denies muscle or joint pain, stiffness, or  dystonia.  Denies dizziness, syncope, seizures, numbness, tingling, tremor, tics, unsteady gait, slurred speech, confusion.   Individual Medical History/ Review of Systems: Changes? :Yes  She has a scratchy throat and postnasal drip.  Because of the coronavirus pandemic concerns, she was sent home from work today because of that.  He is not running a fever and states she does not really feel bad.  Past medications for mental health diagnoses include: Wellbutrin, Effexor, Abilify, Zoloft, Depakote, and possibly Cymbalta, prozac was ineffective.   Allergies: Erythromycin  Current Medications:  Current Outpatient Medications:  .  levonorgestrel (MIRENA) 20 MCG/24HR IUD, 1 each once by Intrauterine route., Disp: , Rfl:  .  azelastine (ASTELIN) 0.1 % nasal spray, Place 1 spray into both nostrils 2 (two) times daily. Use in each nostril as directed (Patient not taking: Reported on 08/31/2018), Disp: 30 mL, Rfl: 0 .  dextromethorphan-guaiFENesin (MUCINEX DM) 30-600 MG 12hr tablet, Take 1 tablet by mouth 2 (two) times daily as needed for cough. (Patient not taking: Reported on 08/31/2018), Disp: 16 tablet, Rfl: 0 .  escitalopram (LEXAPRO) 10 MG tablet, Take 1 tablet (10 mg total) by mouth daily., Disp: 30 tablet, Rfl: 1 .  FLUoxetine (PROZAC) 20 MG tablet, Take 1 tablet (20 mg total) by mouth daily. (Patient not taking: Reported on 08/31/2018), Disp: 90 tablet, Rfl: 0 .  hydrOXYzine (ATARAX/VISTARIL) 10 MG tablet, Take 1-2 tablets (10-20 mg total) by mouth every 6 (six) hours as needed., Disp: 60 tablet, Rfl: 1 Medication Side Effects: none  Family Medical/ Social History: Changes? Yes changes that are caused by  coronavirus pandemic with children at home.  MENTAL HEALTH EXAM:  There were no vitals taken for this visit.There is no height or weight on file to calculate BMI.  General Appearance: Phone visit unable to assess  Eye Contact:  Unable to assess  Speech:  Clear and Coherent she sounds hoarse over  the phone.  Volume:  Normal  Mood:  Euthymic  Affect:  Unable to assess  Thought Process:  Goal Directed  Orientation:  Full (Time, Place, and Person)  Thought Content: Logical   Suicidal Thoughts:  No  Homicidal Thoughts:  No  Memory:  WNL  Judgement:  Good  Insight:  Good  Psychomotor Activity:  Unable to assess  Concentration:  Concentration: Good  Recall:  Good  Fund of Knowledge: Good  Language: Good  Assets:  Desire for Improvement  ADL's:  Intact  Cognition: WNL  Prognosis:  Good    DIAGNOSES:    ICD-10-CM   1. Generalized anxiety disorder F41.1   2. History of drug use Z87.898     Receiving Psychotherapy: No    RECOMMENDATIONS: We discussed different options.  I recommend that she take an SSRI or SNRI.  We agreed to try one more SSRI and decided on Lexapro.  Other options would be Trintellix or Viibryd.  Due to expense we prefer to try a generic first. Start Lexapro 10 mg 1 daily. Start hydroxyzine 10 mg 1-2 every 6 hours as needed anxiety.  It is been over 10 years but she does have a history of past drug use and I do not want to use a benzo. Discussed different coping techniques. Return in 4 to 6 weeks.   Melony Overlyeresa Corlis Angelica, PA-C   This record has been created using [employer] software.  Chart creation errors have been sought, but may not always have been located and corrected. Such creation errors do not reflect on the standard of medical care.

## 2018-10-12 ENCOUNTER — Telehealth: Payer: Self-pay | Admitting: Physician Assistant

## 2018-10-12 NOTE — Telephone Encounter (Signed)
Pt needs all meds moved to [facility] on [address]. Pt insurance ended.

## 2018-10-13 ENCOUNTER — Other Ambulatory Visit: Payer: Self-pay | Admitting: Physician Assistant

## 2018-10-13 ENCOUNTER — Other Ambulatory Visit: Payer: Self-pay

## 2018-10-13 MED ORDER — ESCITALOPRAM OXALATE 10 MG PO TABS: 10.0000 mg | ORAL_TABLET | Freq: Every day | ORAL | 1 refills | Status: DC

## 2018-10-13 MED ORDER — HYDROXYZINE HCL 10 MG PO TABS: 10.0000 mg | ORAL_TABLET | Freq: Four times a day (QID) | ORAL | 1 refills | Status: DC | PRN

## 2018-10-13 MED ORDER — FLUOXETINE HCL 20 MG PO TABS: 20.0000 mg | ORAL_TABLET | Freq: Every day | ORAL | 0 refills | Status: DC

## 2018-10-13 NOTE — Telephone Encounter (Signed)
Sent to [facility] per request

## 2018-10-13 NOTE — Telephone Encounter (Signed)
Change in pharmacy, send to [facility].

## 2018-12-08 IMAGING — CT CT ABD-PELV W/ CM
2 of 4 series · 16 of 46 positions shown, 18 images · IV contrast (iopamidol)
Comparison: Abdominal radiograph 03/30/2017

CLINICAL DATA: Patient with abdominal pain and constipation.

EXAM:
CT ABDOMEN AND PELVIS WITH CONTRAST
TECHNIQUE: Multidetector CT imaging of the abdomen and pelvis was performed
using the standard protocol following bolus administration of
intravenous contrast.
CONTRAST:  100mL [id number] IOPAMIDOL ([id number]) INJECTION 61%

[Series 2: abd/pel w · axial · 0.62mm/px · z∈[-409,-74]mm · 13 of 75 slices shown, 15 images]
[im 4/75  soft-tissue]
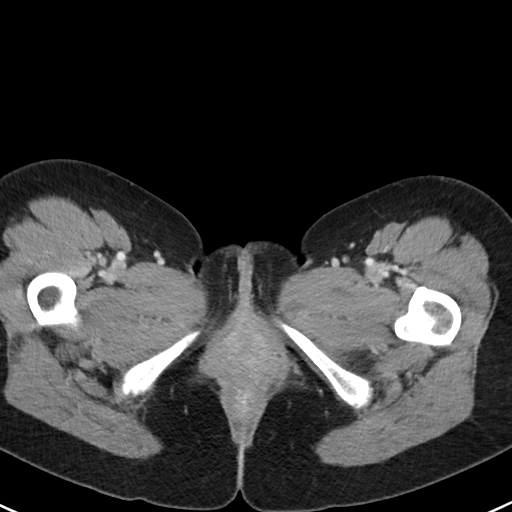
[im 4/75  bone]
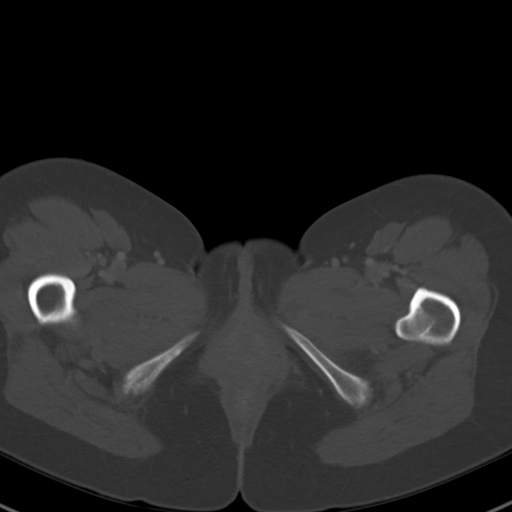
[im 10/75  soft-tissue]
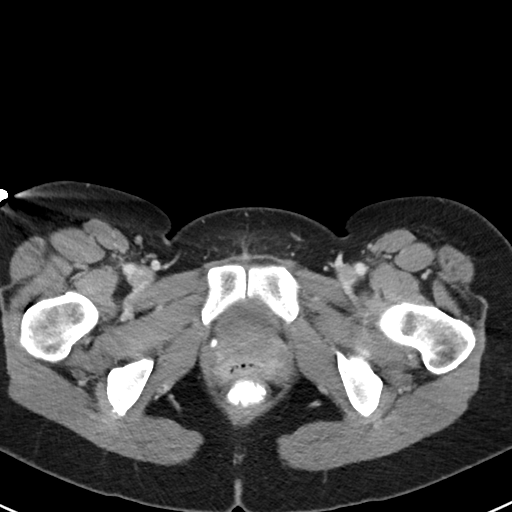
[im 16/75  soft-tissue]
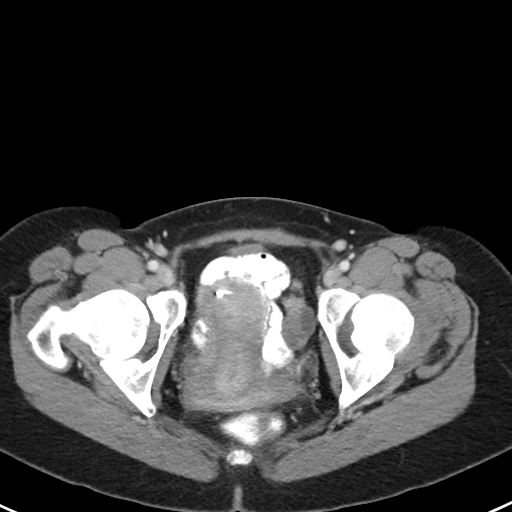
[im 22/75  soft-tissue]
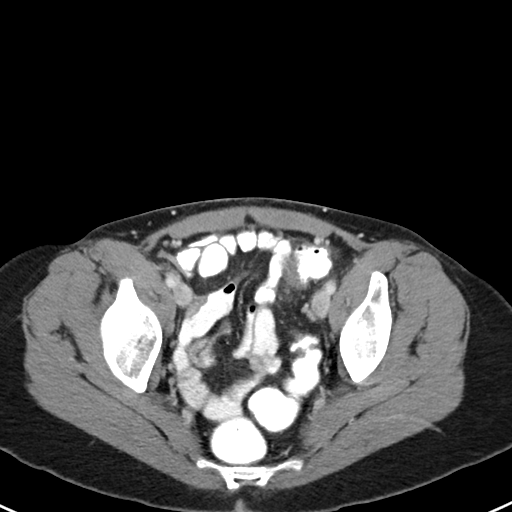
[im 25/75  soft-tissue]
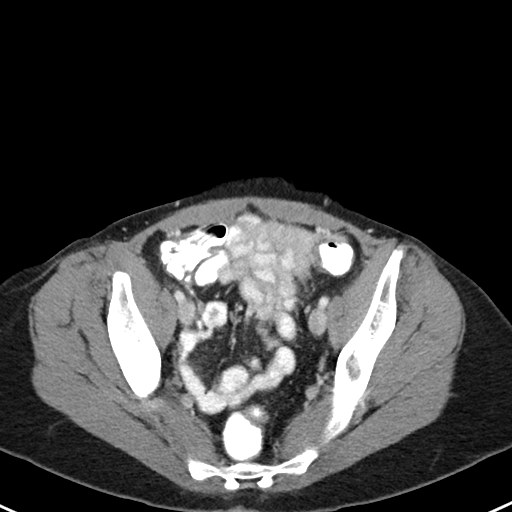
[im 31/75  soft-tissue]
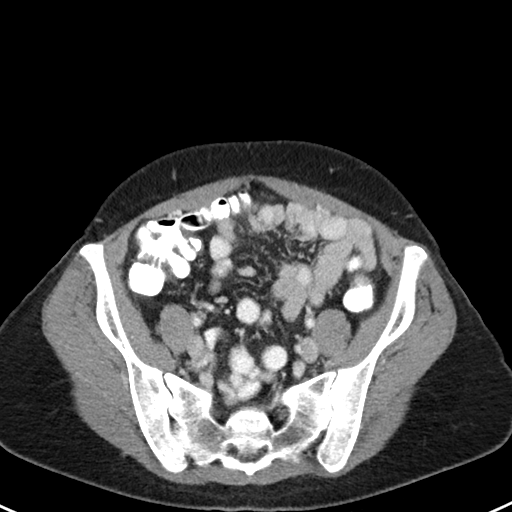
[im 38/75  soft-tissue]
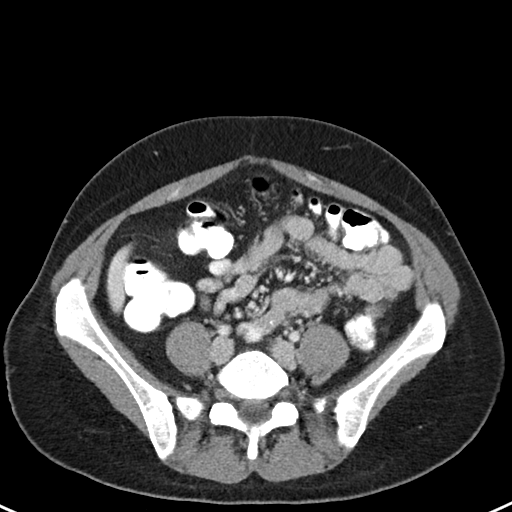
[im 44/75  soft-tissue]
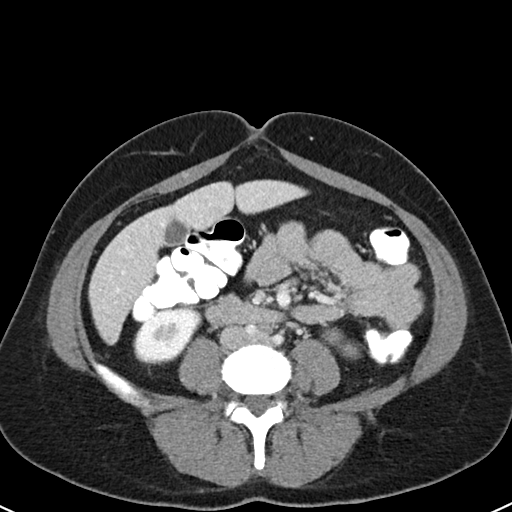
[im 50/75  soft-tissue]
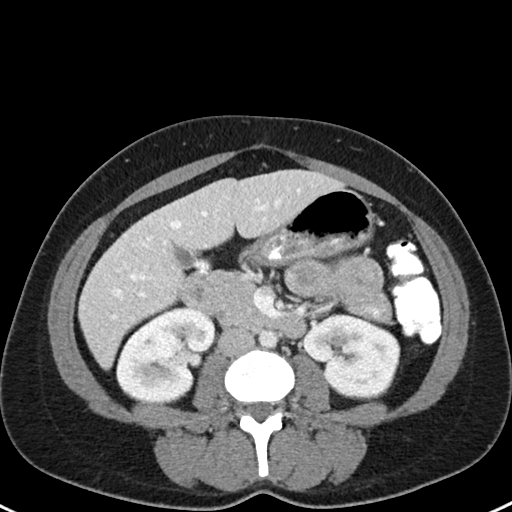
[im 50/75  bone]
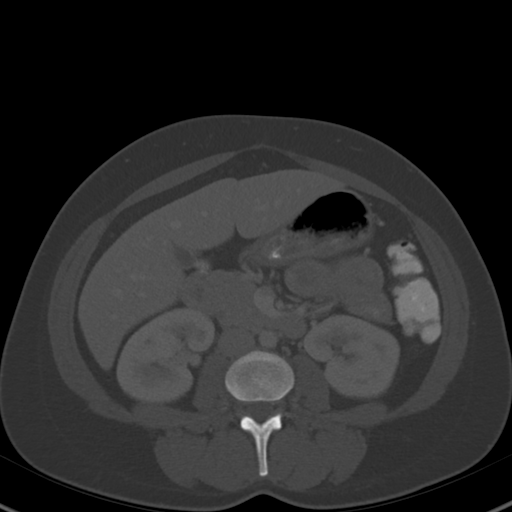
[im 53/75  soft-tissue]
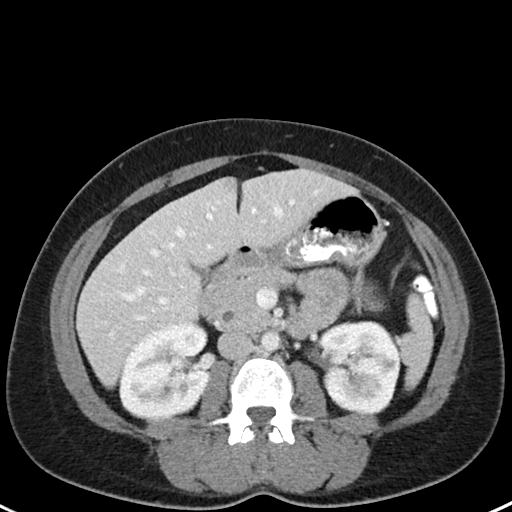
[im 59/75  soft-tissue]
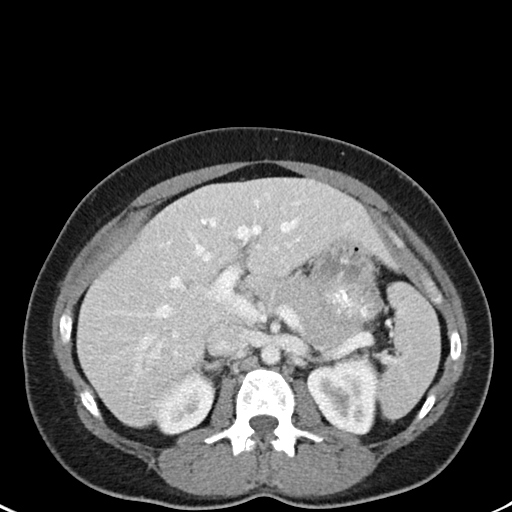
[im 65/75  soft-tissue]
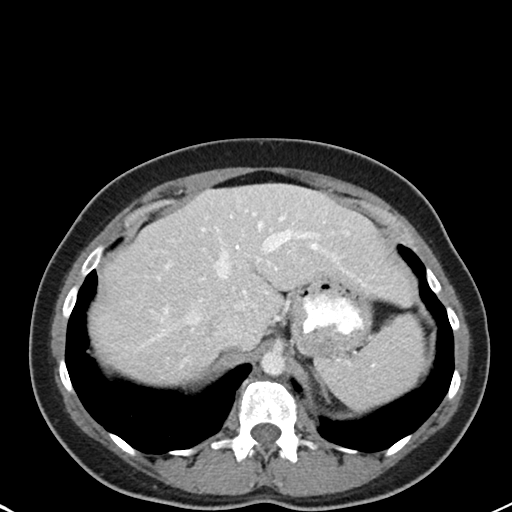
[im 71/75  soft-tissue]
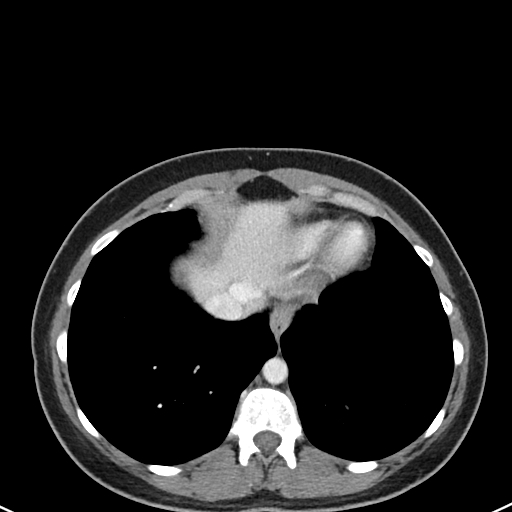

[Series 5: abd/pel w st · coronal · 0.58mm/px · 3 of 69 slices shown]
[im 23/69  soft-tissue]
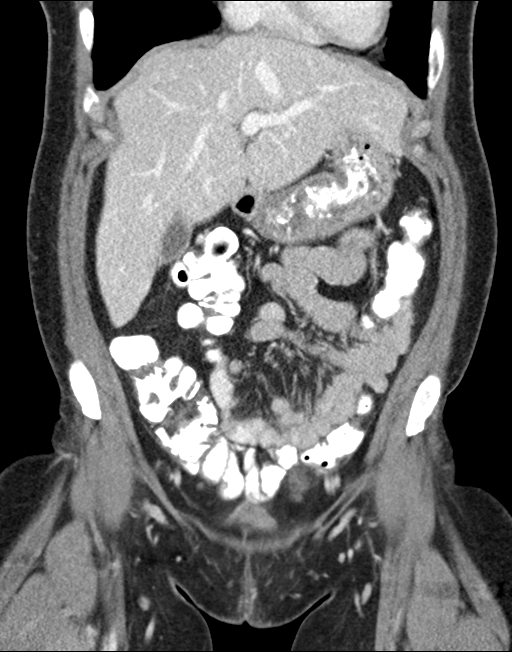
[im 31/69  soft-tissue]
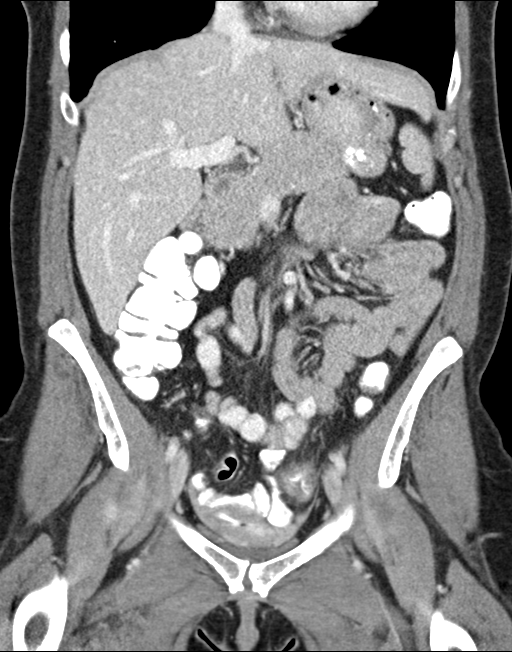
[im 38/69  soft-tissue]
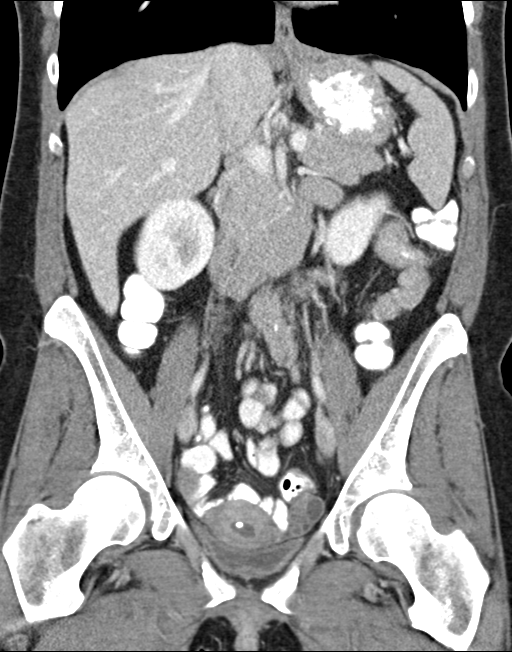

[16 of 46 positions shown; findings below may reference images not displayed]

FINDINGS: Lower chest: Bandlike opacities compatible with atelectasis right
lower lobe.

Hepatobiliary: Liver is normal in size and contour. Subcentimeter
too small to characterize low-attenuation lesions within the left
hepatic lobe (image 16; series 2) and adjacent to the gallbladder
fossa (image 25; series 2). Gallbladder is unremarkable. No
intrahepatic or extrahepatic biliary ductal dilatation.

Pancreas: Unremarkable

Spleen: Unremarkable

Adrenals/Urinary Tract: The adrenal glands are normal. Kidneys
enhance symmetrically with contrast. No hydronephrosis. 8 mm too
small to characterize low-attenuation lesion interpolar region left
kidney (image 22; series 2). Urinary bladder is unremarkable. No
hydronephrosis.

Stomach/Bowel: Along the anterior margin of the sigmoid colon there
is an inflamed 2.5 cm mass with central fat density (image 57;
series 2) most compatible with epiploic appendagitis. No evidence
for upstream bowel obstruction. No free fluid or free
intraperitoneal air. Normal morphology of the stomach. Normal
appendix.

Vascular/Lymphatic: Normal caliber abdominal aorta. No
retroperitoneal lymphadenopathy.

Reproductive: Intrauterine device is present. Adnexal structures are
unremarkable.

Other: None.

Musculoskeletal: No aggressive or acute appearing osseous lesions.
IMPRESSION: Findings most compatible with epiploic appendagitis along the
anterior margin of the sigmoid colon.

## 2019-03-06 ENCOUNTER — Other Ambulatory Visit: Payer: Self-pay

## 2019-03-06 DIAGNOSIS — Z20822 Contact with and (suspected) exposure to covid-19: Secondary | ICD-10-CM

## 2019-03-08 LAB — NOVEL CORONAVIRUS, NAA: SARS-CoV-2, NAA: NOT DETECTED

## 2020-04-23 LAB — HM PAP SMEAR

## 2020-04-23 LAB — RESULTS CONSOLE HPV: CHL HPV: NEGATIVE

## 2020-05-14 ENCOUNTER — Ambulatory Visit: Payer: Medicaid Other | Attending: Internal Medicine

## 2020-05-14 ENCOUNTER — Other Ambulatory Visit (HOSPITAL_COMMUNITY): Payer: Self-pay | Admitting: Internal Medicine

## 2020-05-14 DIAGNOSIS — Z23 Encounter for immunization: Secondary | ICD-10-CM

## 2020-05-14 NOTE — Progress Notes (Signed)
   Covid-19 Vaccination Clinic  Name:  Alexandria Gutierrez    MRN: [id number] DOB: 06-01-1980  05/14/2020  Alexandria Gutierrez was observed post Covid-19 immunization for 15 minutes without incident. She was provided with Vaccine Information Sheet and instruction to access the V-Safe system.   Alexandria Gutierrez was instructed to call 911 with any severe reactions post vaccine: Marland Kitchen Difficulty breathing  . Swelling of face and throat  . A fast heartbeat  . A bad rash all over body  . Dizziness and weakness   Immunizations Administered    Name Date Dose VIS Date Route   Pfizer COVID-19 Vaccine 05/14/2020 11:56 AM 0.3 mL 03/12/2020 Intramuscular   Manufacturer: [employer], [employer]   Lot: [id number]   NDC: [address]-7

## 2021-05-24 HISTORY — PX: EXCISION OF ENDOMETRIOMA: SHX6473

## 2021-07-16 ENCOUNTER — Emergency Department (HOSPITAL_BASED_OUTPATIENT_CLINIC_OR_DEPARTMENT_OTHER): Payer: Managed Care, Other (non HMO)

## 2021-07-16 ENCOUNTER — Other Ambulatory Visit: Payer: Self-pay

## 2021-07-16 ENCOUNTER — Emergency Department (HOSPITAL_BASED_OUTPATIENT_CLINIC_OR_DEPARTMENT_OTHER)
Admission: EM | Admit: 2021-07-16 | Discharge: 2021-07-16 | Disposition: A | Discharge status: Home / Self Care | Payer: Managed Care, Other (non HMO) | Attending: Emergency Medicine | Admitting: Emergency Medicine

## 2021-07-16 ENCOUNTER — Encounter (HOSPITAL_BASED_OUTPATIENT_CLINIC_OR_DEPARTMENT_OTHER): Payer: Self-pay

## 2021-07-16 DIAGNOSIS — R1031 Right lower quadrant pain: Secondary | ICD-10-CM | POA: Diagnosis not present

## 2021-07-16 DIAGNOSIS — N939 Abnormal uterine and vaginal bleeding, unspecified: Secondary | ICD-10-CM | POA: Insufficient documentation

## 2021-07-16 DIAGNOSIS — R102 Pelvic and perineal pain: Secondary | ICD-10-CM

## 2021-07-16 DIAGNOSIS — R109 Unspecified abdominal pain: Secondary | ICD-10-CM

## 2021-07-16 LAB — CBC WITH DIFFERENTIAL/PLATELET
Abs Immature Granulocytes: 0.02 10*3/uL (ref 0.00–0.07)
Basophils Absolute: 0.1 10*3/uL (ref 0.0–0.1)
Basophils Relative: 1 %
Eosinophils Absolute: 0.2 10*3/uL (ref 0.0–0.5)
Eosinophils Relative: 3 %
HCT: 42 % (ref 36.0–46.0)
Hemoglobin: 14.1 g/dL (ref 12.0–15.0)
Immature Granulocytes: 0 %
Lymphocytes Relative: 35 %
Lymphs Abs: 2.3 10*3/uL (ref 0.7–4.0)
MCH: 30.4 pg (ref 26.0–34.0)
MCHC: 33.6 g/dL (ref 30.0–36.0)
MCV: 90.5 fL (ref 80.0–100.0)
Monocytes Absolute: 0.6 10*3/uL (ref 0.1–1.0)
Monocytes Relative: 8 %
Neutro Abs: 3.5 10*3/uL (ref 1.7–7.7)
Neutrophils Relative %: 53 %
Platelets: 196 10*3/uL (ref 150–400)
RBC: 4.64 MIL/uL (ref 3.87–5.11)
RDW: 12.6 % (ref 11.5–15.5)
WBC: 6.6 10*3/uL (ref 4.0–10.5)
nRBC: 0 % (ref 0.0–0.2)

## 2021-07-16 LAB — BASIC METABOLIC PANEL
Anion gap: 8 (ref 5–15)
BUN: 7 mg/dL (ref 6–20)
CO2: 27 mmol/L (ref 22–32)
Calcium: 9.7 mg/dL (ref 8.9–10.3)
Chloride: 105 mmol/L (ref 98–111)
Creatinine, Ser: 0.56 mg/dL (ref 0.44–1.00)
GFR, Estimated: >60 mL/min (ref >60)
Glucose, Bld: 93 mg/dL (ref 70–99)
Potassium: 3.9 mmol/L (ref 3.5–5.1)
Sodium: 140 mmol/L (ref 135–145)

## 2021-07-16 LAB — PREGNANCY, URINE: Preg Test, Ur: NEGATIVE

## 2021-07-16 MED ORDER — IOHEXOL 300 MG/ML  SOLN
80.0000 mL | Freq: Once | INTRAMUSCULAR | Status: AC | PRN
  Administered 2021-07-16: 75 mL via INTRAVENOUS

## 2021-07-16 MED ORDER — SODIUM CHLORIDE 0.9 % IV SOLN: INTRAVENOUS | Status: DC

## 2021-07-16 NOTE — ED Notes (Signed)
Patient transported to Ultrasound 

## 2021-07-16 NOTE — ED Provider Notes (Signed)
[facility] EMERGENCY DEPT Provider Note   CSN: [id number] Arrival date & time: 07/16/21  1749     History  Chief Complaint  Patient presents with   Abdominal Pain    RLQ   Vaginal Bleeding    Alexandria Gutierrez is a 41 y.o. female.  41 year old female who presents with sudden onset of vaginal bleeding x1 day.  Patient states her last period was in November.  She currently has Mirena device in.  States that she has not been passing any clots.  Has had mild uterine cramping without fever or vomiting.  Also for several days has noted a movable mass in her right lower quadrant.  No urinary symptoms.      Home Medications Prior to Admission medications   Medication Sig Start Date End Date Taking? Authorizing Provider  azelastine (ASTELIN) 0.1 % nasal spray Place 1 spray into both nostrils 2 (two) times daily. Use in each nostril as directed Patient not taking: Reported on 08/31/2018 06/29/18   Zachery Dauer, NP  dextromethorphan-guaiFENesin [facility] DM) 30-600 MG 12hr tablet Take 1 tablet by mouth 2 (two) times daily as needed for cough. Patient not taking: Reported on 08/31/2018 06/29/18   Zachery Dauer, NP  escitalopram (LEXAPRO) 10 MG tablet Take 1 tablet (10 mg total) by mouth daily. 10/13/18   Melony Overly T, PA-C  hydrOXYzine (ATARAX/VISTARIL) 10 MG tablet Take 1-2 tablets (10-20 mg total) by mouth every 6 (six) hours as needed. 10/13/18   Cherie Ouch, PA-C  levonorgestrel (MIRENA) 20 MCG/24HR IUD 1 each once by Intrauterine route.    [provider]      Allergies    Erythromycin    Review of Systems   Review of Systems  All other systems reviewed and are negative.  Physical Exam Updated Vital Signs BP 138/83 (BP Location: Left Arm)    Pulse 81    Temp 98.2 F (36.8 C) (Oral)    Resp 15    Ht 1.6 m (5\' 3" )    Wt 56.7 kg    SpO2 100%    BMI 22.14 kg/m  Physical Exam Vitals and nursing note reviewed.  Constitutional:      General: She is not in  acute distress.    Appearance: Normal appearance. She is well-developed. She is not toxic-appearing.  HENT:     Head: Normocephalic and atraumatic.  Eyes:     General: Lids are normal.     Conjunctiva/sclera: Conjunctivae normal.     Pupils: Pupils are equal, round, and reactive to light.  Neck:     Thyroid: No thyroid mass.     Trachea: No tracheal deviation.  Cardiovascular:     Rate and Rhythm: Normal rate and regular rhythm.     Heart sounds: Normal heart sounds. No murmur heard.   No gallop.  Pulmonary:     Effort: Pulmonary effort is normal. No respiratory distress.     Breath sounds: Normal breath sounds. No stridor. No decreased breath sounds, wheezing, rhonchi or rales.  Abdominal:     General: There is no distension.     Palpations: Abdomen is soft.     Tenderness: There is no abdominal tenderness. There is no rebound.    Musculoskeletal:        General: No tenderness. Normal range of motion.     Cervical back: Normal range of motion and neck supple.  Skin:    General: Skin is warm and dry.     Findings: No  abrasion or rash.  Neurological:     Mental Status: She is alert and oriented to person, place, and time. Mental status is at baseline.     GCS: GCS eye subscore is 4. GCS verbal subscore is 5. GCS motor subscore is 6.     Cranial Nerves: No cranial nerve deficit.     Sensory: No sensory deficit.     Motor: Motor function is intact.  Psychiatric:        Attention and Perception: Attention normal.        Speech: Speech normal.        Behavior: Behavior normal.    ED Results / Procedures / Treatments   Labs (all labs ordered are listed, but only abnormal results are displayed) Labs Reviewed  PREGNANCY, URINE    EKG None  Radiology No results found.  Procedures Procedures    Medications Ordered in ED Medications - No data to display  ED Course/ Medical Decision Making/ A&P                           Medical Decision Making Amount and/or  Complexity of Data Reviewed Labs: ordered. Radiology: ordered. ECG/medicine tests: ordered.  Risk Prescription drug management.   Patient presents with abdominal discomfort.  Concern for possible ovarian torsion as she has severe acute pain.  Abdominal ultrasound did not show any evidence of torsion.  Radiology read in my exam of the images.  Patient's laboratory studies are reassuring here.  Patient had a movable mass appreciated right lower quadrant subsequently had abdominal CT which the results and images were reviewed by me and agree with radiology report.  Patient appears to have ectopic endometrium in her abdominal wall.  This would explain her symptoms she will follow-up with her gynecologist        Final Clinical Impression(s) / ED Diagnoses Final diagnoses:  None    Rx / DC Orders ED Discharge Orders     None         Lorre Nick, MD 07/16/21 1338

## 2021-07-16 NOTE — Discharge Instructions (Signed)
You likely have endometrial tissue in your anterior abdominal wall.  Use ibuprofen as needed for your discomfort and follow-up with the gynecologist of your choice in the next 1 to 2 weeks

## 2021-07-16 NOTE — ED Triage Notes (Signed)
Pt came in POV c/o of RLQ Pain that has been intermittent since her c-cestion 12/2015. Pt has recently found lump/knot that is tender at the RLQ. Pt has merina IUD that has been in place since 02/2016.  Pt did have it replaced in 03/2021 and since pain has been noted. Pt has had no vag bleeding prior and today noted vag bleeding.

## 2021-08-12 ENCOUNTER — Encounter (HOSPITAL_COMMUNITY): Payer: Self-pay | Admitting: Radiology

## 2021-08-21 ENCOUNTER — Encounter: Payer: Self-pay | Admitting: Obstetrics and Gynecology

## 2021-08-21 ENCOUNTER — Ambulatory Visit (INDEPENDENT_AMBULATORY_CARE_PROVIDER_SITE_OTHER): Payer: Managed Care, Other (non HMO) | Admitting: Obstetrics and Gynecology

## 2021-08-21 VITALS — BP 118/80 | Resp 14 | Ht 63.0 in | Wt 125.0 lb

## 2021-08-21 DIAGNOSIS — R1031 Right lower quadrant pain: Secondary | ICD-10-CM

## 2021-08-21 DIAGNOSIS — R1903 Right lower quadrant abdominal swelling, mass and lump: Secondary | ICD-10-CM | POA: Diagnosis not present

## 2021-08-21 NOTE — Progress Notes (Signed)
41 y.o. G3P2103 Married White or Caucasian Not Hispanic or Latino female here for evaluation of abdominal pain. She was sent by Newton Pigg, FNP ? ?She reports a 6 year h/o intermittent abdominal pain in her RLQ, started after her C/S. Over the last 6 months the pain has gotten more frequent and more intense.  ? ?She went to the ER last month for heavy vaginal bleeding and severe RLQ abdominal pain. A month prior to that visit she noticed a lump in her abdominal wall. ?The pain is a constant dull pain, 3/10 in severity. If she sneezes the pain goes to a 5/10 and feels like a stabbing pain. She has noticed that the pain is worse for a couple of days in a row, the baseline pain goes to 4/10. ?Since last month she has noticed that the mass in her abdominal wall got a little bigger when the pain got worse.  ? ?She was recently seen in the ER for abdominal pain and was noted to have ectopic endometrium in her abdominal wall. ER note was reviewed.  ? ?She has a mirena IUD, she has had one since 6 weeks post partum. Last one was placed last fall.  ? ?07/16/21 u/s ?IMPRESSION: ?1. Unremarkable sonographic appearance of the uterus and ovaries. ?2. IUD in place ? ?07/16/21 CT: ?Musculoskeletal: No acute or significant osseous findings. Within ?the right lower rectus abdominus muscle, there is a 1.7 x 1.5 by 2.0 ?cm enhancing mass (series 2, image 54, series 6 image 50). ?  ?IMPRESSION: ?2.0 cm enhancing mass within the right lower rectus abdominus ?muscle, in the setting of prior cesarean section this is highly ?suspicious for an endometrial implant. Recommend direct tissue ?sampling or excision for confirmation. ?  ?No other acute abnormality in the abdomen or pelvis. The appendix is ?normal. ? ?She has no prior h/o endometriosis. The bleeding she had which caused her to go to the ER was very heavy but only lasted for one day. She typically doesn't bleed at all with the IUD.  ? ?She has irregular bowels, has a BM daily  but they can be hard or very loose. No pain with BM.  ?No bladder c/o. Slight GSI.  ? ?Patient's last menstrual period was 07/16/2021.          ?Sexually active: Yes.    ?The current method of family planning is IUD.    ?Exercising: Yes.     Floor exercises ?Smoker:  yes, 5 cigarettes a day.  ? ?Health Maintenance: ?Pap:  PAP 2021  ?History of abnormal Pap:  yes, she had cryo at age 89, had a leep ~2011-2012 ?MMG:  mammo 06/15/2012  ?BMD:   n/a  ?Colonoscopy: n/a  ?TDaP:  2017 ?Gardasil: n/a ? ? reports that she has been smoking cigarettes. She has a 5.50 pack-year smoking history. She has never used smokeless tobacco. She reports current alcohol use. She reports current drug use. Drug: Marijuana. She is a CMA, recently changed to the front desk. Kids are 5.5, 8 and 19.  ? ?Past Medical History:  ?Diagnosis Date  ? Abnormal finding on Pap smear 01/2011  ? HSIL  ? Chicken pox   ? Depression   ? History of blood transfusion 01/16/2016  ? HSIL (high grade squamous intraepithelial lesion) on Pap smear 04/12/2011  ? Postpartum care following vaginal delivery (08/04/13) 08/04/2013  ? SVD (spontaneous vaginal delivery) 08/04/2013  ? ? ?Past Surgical History:  ?Procedure Laterality Date  ? CESAREAN SECTION N/A 01/16/2016  ?  Procedure: CESAREAN SECTION;  Surgeon: Myna Hidalgo, DO;  Location: [facility];  Service: Obstetrics;  Laterality: N/A;  ? CYSTECTOMY Right 41 or 41 years old  ? on wrist  ? WISDOM TOOTH EXTRACTION    ?H/o 2 vaginal deliveries, then an emergency c/s at 30 weeks for an abruption.  ? ?Current Outpatient Medications  ?Medication Sig Dispense Refill  ? clonazePAM (KLONOPIN) 1 MG tablet Take 1 mg by mouth 2 (two) times daily.    ? levonorgestrel (MIRENA) 20 MCG/24HR IUD 1 each once by Intrauterine route.    ? sertraline (ZOLOFT) 100 MG tablet Take 100 mg by mouth daily.    ? VRAYLAR 1.5 MG capsule Take 1.5 mg by mouth daily. (Patient not taking: Reported on 08/21/2021)    ? ?No current facility-administered  medications for this visit.  ? ? ?Family History  ?Problem Relation Age of Onset  ? Alcohol abuse Maternal Aunt   ? Mental illness Maternal Aunt   ? Endometriosis Mother   ? Heart disease Maternal Grandmother   ? Heart disease Maternal Grandfather   ? Stroke Paternal Grandfather   ? Colon cancer Neg Hx   ? Esophageal cancer Neg Hx   ? Rectal cancer Neg Hx   ? ? ?Review of Systems  ?Genitourinary:   ?     Endometriosis  ? ?Exam:   ?BP 118/80 (BP Location: Right Arm, Patient Position: Sitting, Cuff Size: Normal)   Resp 14   Ht 5\' 3"  (1.6 m)   Wt 125 lb (56.7 kg)   LMP 07/16/2021   BMI 22.14 kg/m?   Weight change: @WEIGHTCHANGE @ Height:   Height: 5\' 3"  (160 cm)  ?Ht Readings from Last 3 Encounters:  ?08/21/21 5\' 3"  (1.6 m)  ?07/16/21 5\' 3"  (1.6 m)  ?07/13/18 5\' 3"  (1.6 m)  ? ? ?General appearance: alert, cooperative and appears stated age ?Abdomen: soft, there is a firm, tender, ~2 cm lump in her lower right abdominal wall. Otherwise not tender.  ? ?1. Abdominal wall mass of right lower quadrant ?Suspected endometriosis. She has a mirena IUD, not a candidate for OCP's.  ?Desires removal of this mass ? ?Addendum: I have reviewed her images and consulted with my partner. I will refer to General surgery for removal of the abdominal wall mass.  ? ?CC: 08/23/21 ?

## 2021-08-21 NOTE — Progress Notes (Deleted)
GYNECOLOGY  VISIT ?  ?HPI: ?41 y.o.   Married White or Caucasian Not Hispanic or Latino  female   ?[id number] with No LMP recorded. (Menstrual status: IUD).   ?here for endometriosis ? ?GYNECOLOGIC HISTORY: ?No LMP recorded. (Menstrual status: IUD). ?Contraception:iud ?Menopausal hormone therapy: n/a ?       ?OB History   ? ? Gravida  ?3  ? Para  ?3  ? Term  ?2  ? Preterm  ?1  ? AB  ?0  ? Living  ?3  ?  ? ? SAB  ?0  ? IAB  ?0  ? Ectopic  ?0  ? Multiple  ?0  ? Live Births  ?2  ?   ?  ?  ?    ? ?Patient Active Problem List  ? Diagnosis Date Noted  ? Right elbow pain 07/13/2018  ? Benign hypermobility syndrome 07/13/2018  ? B12 deficiency 12/30/2017  ? Anxiety disorder, unspecified 12/26/2017  ? Insomnia 07/29/2017  ? Depression, major, recurrent ([place]) 07/29/2017  ? Antepartum placental abruption 01/16/2016  ? Rh negative state in antepartum period 01/16/2016  ? Status post primary low transverse cesarean section--complete abruption 01/16/2016  ? Smoker 01/16/2016  ? Abnormal Pap smear of cervix 08/22/2015  ? Mantoux: positive 08/18/2015  ? CIN II (cervical intraepithelial neoplasia II) 04/12/2011  ? ? ?Past Medical History:  ?Diagnosis Date  ? Abnormal finding on Pap smear 01/2011  ? HSIL  ? Chicken pox   ? Depression   ? History of blood transfusion 01/16/2016  ? HSIL (high grade squamous intraepithelial lesion) on Pap smear 04/12/2011  ? Postpartum care following vaginal delivery (08/04/13) 08/04/2013  ? SVD (spontaneous vaginal delivery) 08/04/2013  ? ? ?Past Surgical History:  ?Procedure Laterality Date  ? CESAREAN SECTION N/A 01/16/2016  ? Procedure: CESAREAN SECTION;  Surgeon: Janyth Pupa, DO;  Location: [facility];  Service: Obstetrics;  Laterality: N/A;  ? CYSTECTOMY Right 89 or 41 years old  ? on wrist  ? WISDOM TOOTH EXTRACTION    ? ? ?Current Outpatient Medications  ?Medication Sig Dispense Refill  ? levonorgestrel (MIRENA) 20 MCG/24HR IUD 1 each once by Intrauterine route.    ? VRAYLAR 1.5 MG capsule Take  1.5 mg by mouth daily. (Patient not taking: Reported on 08/21/2021)    ? ?No current facility-administered medications for this visit.  ?  ? ?ALLERGIES: Erythromycin ? ?Family History  ?Problem Relation Age of Onset  ? Alcohol abuse Maternal Aunt   ? Mental illness Maternal Aunt   ? Endometriosis Mother   ? Heart disease Maternal Grandmother   ? Heart disease Maternal Grandfather   ? Stroke Paternal Grandfather   ? Colon cancer Neg Hx   ? Esophageal cancer Neg Hx   ? Rectal cancer Neg Hx   ? ? ?Social History  ? ?Socioeconomic History  ? Marital status: Married  ?  Spouse name: Not on file  ? Number of children: 3  ? Years of education: Not on file  ? Highest education level: Not on file  ?Occupational History  ? Occupation: CMA  ?Tobacco Use  ? Smoking status: Every Day  ?  Packs/day: 0.25  ?  Years: 22.00  ?  Pack years: 5.50  ?  Types: Cigarettes  ? Smokeless tobacco: Never  ? Tobacco comments:  ?  Tobacco info given 03/30/17  ?Vaping Use  ? Vaping Use: Never used  ?Substance and Sexual Activity  ? Alcohol use: Yes  ?  Alcohol/week: 0.0  standard drinks  ?  Comment: rare  ? Drug use: Yes  ?  Types: Marijuana  ?  Comment: occas  ? Sexual activity: Yes  ?  Partners: Male  ?  Birth control/protection: I.U.D.  ?  Comment: pregnant  ?Other Topics Concern  ? Not on file  ?Social History Narrative  ? Not on file  ? ?Social Determinants of Health  ? ?Financial Resource Strain: Not on file  ?Food Insecurity: Not on file  ?Transportation Needs: Not on file  ?Physical Activity: Not on file  ?Stress: Not on file  ?Social Connections: Not on file  ?Intimate Partner Violence: Not on file  ? ? ?ROS ? ?PHYSICAL EXAMINATION:   ? ?Ht 5\' 3"  (1.6 m)   Wt 125 lb (56.7 kg)   BMI 22.14 kg/m?     ?General appearance: alert, cooperative and appears stated age ?Neck: no adenopathy, supple, symmetrical, trachea midline and thyroid {CHL AMB PHY EX THYROID NORM DEFAULT:[phone]::"normal to inspection and palpation"} ?Breasts: {Exam;  breast:13139::"normal appearance, no masses or tenderness"} ?Abdomen: soft, non-tender; non distended, no masses,  no organomegaly ? ?Pelvic: External genitalia:  no lesions ?             Urethra:  normal appearing urethra with no masses, tenderness or lesions ?             Bartholins and Skenes: normal    ?             Vagina: normal appearing vagina with normal color and discharge, no lesions ?             Cervix: {CHL AMB PHY EX CERVIX NORM DEFAULT:[phone]::"no lesions"} ?             Bimanual Exam:  Uterus:  {CHL AMB PHY EX UTERUS NORM DEFAULT:[phone]::"normal size, contour, position, consistency, mobility, non-tender"} ?             Adnexa: {CHL AMB PHY EX ADNEXA NO MASS [phone]::"no mass, fullness, tenderness"} ?             Rectovaginal: {yes no:[id number]}.  Confirms. ?             Anus:  normal sphincter tone, no lesions ? ?Chaperone was present for exam. ? ?ASSESSMENT ? ?  ? ?PLAN ? ?  ?An After Visit Summary was printed and given to the patient. ? ?*** minutes face to face time of which over 50% was spent in counseling.  ? ? ? ?

## 2021-08-26 ENCOUNTER — Encounter: Payer: Self-pay | Admitting: Obstetrics and Gynecology

## 2021-08-26 ENCOUNTER — Telehealth: Payer: Self-pay | Admitting: Obstetrics and Gynecology

## 2021-08-26 NOTE — Telephone Encounter (Signed)
Please let the patient know that I have reviewed her CT scans closely and conferred with one of my partners. I think the best course of action is to refer her to General surgery for removal of this abdominal wall mass. ?Please place a referral to Dr Gerrit Friends.  ?

## 2021-08-26 NOTE — Telephone Encounter (Signed)
Patient was informed.  Patient asked instead of Dr. Gerrit Friends if I could make the referral to Dr. Wenda Low at [facility].  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 ? ?I told her I'd make sure that was agreeable with Dr. Oscar La and I will take care of the referral. ?

## 2021-08-26 NOTE — Telephone Encounter (Signed)
Of course, please place there referral to Dr Hassell Done.  ?

## 2021-08-27 NOTE — Telephone Encounter (Signed)
Referral sent to [facility] for appt with Dr. Kaylyn Lim requested. ?

## 2021-08-27 NOTE — Telephone Encounter (Signed)
Note from [facility]: ?"She is scheduled with Dr. Hassell Done in our [place] office on 09/16/21 at 4:00pm to arrive at 3:30. That was our first available." ?

## 2021-12-02 ENCOUNTER — Other Ambulatory Visit: Payer: Self-pay

## 2021-12-02 ENCOUNTER — Encounter (HOSPITAL_BASED_OUTPATIENT_CLINIC_OR_DEPARTMENT_OTHER): Payer: Self-pay | Admitting: Surgery

## 2021-12-10 NOTE — H&P (Signed)
REFERRING PHYSICIAN:  Gertie Exon, MD   PROVIDER:  Katha Cabal, MD   MRN: [id number] DOB: 1980-06-08   Subjective    Chief Complaint: lower abdominal wall mass      History of Present Illness: Alexandria Gutierrez is a 41 y.o. female who is seen today as an office consultation at the request of Dr. Oscar La for evaluation of a lower abdominal mass possibly an endometrioma.   .     She has had pain there intermittently but recently has grown.  She has a palpable mass.  She has not had a period since 2017 on regular basis but in retrospect she does note this think he cyclically hurts.  On CT scan suspects it is an endometrioma and it is palpable on the right side the lower portion near the Pfannenstiel incision.  We discussed excision under general anesthesia as an outpatient.     Review of Systems: See HPI as well for other ROS.   ROS      Medical History: Past Medical History      Past Medical History:  Diagnosis Date   Anemia     Anxiety             Patient Active Problem List  Diagnosis   Abnormal Pap smear of cervix   Antepartum placental abruption   Anxiety disorder, unspecified   B12 deficiency   Benign hypermobility syndrome   CIN II (cervical intraepithelial neoplasia II)   Depression, major, recurrent ([facility])   Insomnia   Mantoux: positive   Rh negative state in antepartum period   RhD negative   Right elbow pain   Smoker   Status post primary low transverse cesarean section      Past Surgical History       Past Surgical History:  Procedure Laterality Date   CESAREAN SECTION       CYSTECTOMY            Allergies       Allergies  Allergen Reactions   Erythromycin Nausea And Vomiting      Spikes high fever to 103              Current Outpatient Medications on File Prior to Visit  Medication Sig Dispense Refill   clonazePAM (KLONOPIN) 0.5 MG tablet         levonorgestreL (MIRENA) IUD Mirena 21 mcg/24 hours (8 yrs) 52 mg  intrauterine device  Take 1 insert by intrauterine route.       sertraline (ZOLOFT) 100 MG tablet Take 100 mg by mouth once daily        No current facility-administered medications on file prior to visit.      Family History  History reviewed. No pertinent family history.      Social History        Tobacco Use  Smoking Status Every Day   Packs/day: 0.25   Types: Cigarettes  Smokeless Tobacco Never      Social History  Social History         Socioeconomic History   Marital status: Married  Tobacco Use   Smoking status: Every Day      Packs/day: 0.25      Types: Cigarettes   Smokeless tobacco: Never  Vaping Use   Vaping Use: Never used  Substance and Sexual Activity   Alcohol use: Not Currently   Drug use: Yes      Types: Marijuana        Objective:  Vitals:    11/13/21 1322  BP: 110/68  Pulse: 100  SpO2: 99%  Weight: 56.8 kg (125 lb 3.2 oz)  Height: 160 cm (5\' 3" )    Body mass index is 22.18 kg/m.   Physical Exam General: Thin white female no acute distress HEENT  : Unremarkable except for glasses Chest: Clear Heart: Sinus rhythm Breast: Not examined Abdomen: Palpable tender area in the lower abdomen to the right of midline which is palpable and consistent with an endometrial implant from her C-section GU not examined Rectal not performed Extremities full range of motion Neuro alert and oriented x3.  Motor and sensory function grossly intact.         Labs, Imaging and Diagnostic Testing: CT scan report reviewed indicating there is a 2 cm enhancing mass in the right lower rectus abdominis muscle in the setting of prior C-section which is suspicious for an endometrial implant.  Plan excision under general anesthesia.   Assessment and Plan:  Diagnoses and all orders for this visit:   Endometrioma       Plan to do outpatient excision under general anesthesia.  She is aware of  the risk and benefits.   Return for postop follow up.    Vence Lalor , MD

## 2021-12-11 ENCOUNTER — Ambulatory Visit (HOSPITAL_BASED_OUTPATIENT_CLINIC_OR_DEPARTMENT_OTHER): Payer: Managed Care, Other (non HMO) | Admitting: Certified Registered Nurse Anesthetist

## 2021-12-11 ENCOUNTER — Ambulatory Visit (HOSPITAL_BASED_OUTPATIENT_CLINIC_OR_DEPARTMENT_OTHER)
Admission: RE | Admit: 2021-12-11 | Discharge: 2021-12-11 | Disposition: A | Discharge status: Home / Self Care | Payer: Managed Care, Other (non HMO) | Source: Ambulatory Visit | Attending: Surgery | Admitting: Surgery

## 2021-12-11 ENCOUNTER — Encounter (HOSPITAL_BASED_OUTPATIENT_CLINIC_OR_DEPARTMENT_OTHER)
Admission: RE | Disposition: A | Discharge status: Home / Self Care | Payer: Self-pay | Source: Ambulatory Visit | Attending: Surgery

## 2021-12-11 ENCOUNTER — Encounter (HOSPITAL_BASED_OUTPATIENT_CLINIC_OR_DEPARTMENT_OTHER): Payer: Self-pay | Admitting: Surgery

## 2021-12-11 ENCOUNTER — Other Ambulatory Visit: Payer: Self-pay

## 2021-12-11 DIAGNOSIS — F17210 Nicotine dependence, cigarettes, uncomplicated: Secondary | ICD-10-CM | POA: Diagnosis not present

## 2021-12-11 DIAGNOSIS — N806 Endometriosis in cutaneous scar: Secondary | ICD-10-CM | POA: Diagnosis not present

## 2021-12-11 DIAGNOSIS — F419 Anxiety disorder, unspecified: Secondary | ICD-10-CM | POA: Diagnosis not present

## 2021-12-11 DIAGNOSIS — N80129 Deep endometriosis of ovary, unspecified ovary: Secondary | ICD-10-CM | POA: Diagnosis not present

## 2021-12-11 DIAGNOSIS — F32A Depression, unspecified: Secondary | ICD-10-CM | POA: Diagnosis not present

## 2021-12-11 DIAGNOSIS — N809 Endometriosis, unspecified: Secondary | ICD-10-CM | POA: Diagnosis present

## 2021-12-11 DIAGNOSIS — Z01818 Encounter for other preprocedural examination: Secondary | ICD-10-CM

## 2021-12-11 HISTORY — PX: MASS EXCISION: SHX2000

## 2021-12-11 HISTORY — DX: Anxiety disorder, unspecified: F41.9

## 2021-12-11 LAB — POCT PREGNANCY, URINE: Preg Test, Ur: NEGATIVE

## 2021-12-11 SURGERY — EXCISION MASS
Anesthesia: General | Site: Abdomen

## 2021-12-11 MED ORDER — PROPOFOL 10 MG/ML IV BOLUS
INTRAVENOUS | Status: AC
  Filled 2021-12-11: qty 20

## 2021-12-11 MED ORDER — OXYCODONE HCL 5 MG PO TABS
5.0000 mg | ORAL_TABLET | Freq: Once | ORAL | Status: AC | PRN
  Administered 2021-12-11: 5 mg via ORAL

## 2021-12-11 MED ORDER — BUPIVACAINE LIPOSOME 1.3 % IJ SUSP
INTRAMUSCULAR | Status: AC
Start: 2021-12-11 — End: ?
  Filled 2021-12-11: qty 20

## 2021-12-11 MED ORDER — DEXAMETHASONE SODIUM PHOSPHATE 10 MG/ML IJ SOLN
INTRAMUSCULAR | Status: AC
  Filled 2021-12-11: qty 1

## 2021-12-11 MED ORDER — SUGAMMADEX SODIUM 200 MG/2ML IV SOLN
INTRAVENOUS | Status: DC | PRN
  Administered 2021-12-11: 150 mg via INTRAVENOUS

## 2021-12-11 MED ORDER — KETOROLAC TROMETHAMINE 30 MG/ML IJ SOLN
INTRAMUSCULAR | Status: AC
  Filled 2021-12-11: qty 1

## 2021-12-11 MED ORDER — AMISULPRIDE (ANTIEMETIC) 5 MG/2ML IV SOLN: 10.0000 mg | Freq: Once | INTRAVENOUS | Status: DC | PRN

## 2021-12-11 MED ORDER — CHLORHEXIDINE GLUCONATE CLOTH 2 % EX PADS: 6.0000 | MEDICATED_PAD | Freq: Once | CUTANEOUS | Status: DC

## 2021-12-11 MED ORDER — OXYCODONE HCL 5 MG PO TABS
ORAL_TABLET | ORAL | Status: AC
  Filled 2021-12-11: qty 1

## 2021-12-11 MED ORDER — ACETAMINOPHEN 325 MG PO TABS: 325.0000 mg | ORAL_TABLET | ORAL | Status: DC | PRN

## 2021-12-11 MED ORDER — LIDOCAINE 2% (20 MG/ML) 5 ML SYRINGE
INTRAMUSCULAR | Status: AC
  Filled 2021-12-11: qty 5

## 2021-12-11 MED ORDER — PROMETHAZINE HCL 25 MG/ML IJ SOLN: 6.2500 mg | INTRAMUSCULAR | Status: DC | PRN

## 2021-12-11 MED ORDER — ACETAMINOPHEN 10 MG/ML IV SOLN: 1000.0000 mg | Freq: Once | INTRAVENOUS | Status: DC | PRN

## 2021-12-11 MED ORDER — LACTATED RINGERS IV SOLN: INTRAVENOUS | Status: DC

## 2021-12-11 MED ORDER — FENTANYL CITRATE (PF) 100 MCG/2ML IJ SOLN
INTRAMUSCULAR | Status: DC | PRN
  Administered 2021-12-11: 50 ug via INTRAVENOUS

## 2021-12-11 MED ORDER — CEFAZOLIN SODIUM-DEXTROSE 2-4 GM/100ML-% IV SOLN
INTRAVENOUS | Status: AC
  Filled 2021-12-11: qty 100

## 2021-12-11 MED ORDER — FENTANYL CITRATE (PF) 100 MCG/2ML IJ SOLN
25.0000 ug | INTRAMUSCULAR | Status: DC | PRN
  Administered 2021-12-11: 50 ug via INTRAVENOUS

## 2021-12-11 MED ORDER — ACETAMINOPHEN 160 MG/5ML PO SOLN: 325.0000 mg | ORAL | Status: DC | PRN

## 2021-12-11 MED ORDER — ONDANSETRON HCL 4 MG/2ML IJ SOLN
INTRAMUSCULAR | Status: AC
  Filled 2021-12-11: qty 2

## 2021-12-11 MED ORDER — CEFAZOLIN SODIUM-DEXTROSE 2-4 GM/100ML-% IV SOLN
2.0000 g | INTRAVENOUS | Status: AC
  Administered 2021-12-11: 2 g via INTRAVENOUS

## 2021-12-11 MED ORDER — LIDOCAINE HCL (CARDIAC) PF 100 MG/5ML IV SOSY
PREFILLED_SYRINGE | INTRAVENOUS | Status: DC | PRN
  Administered 2021-12-11: 50 mg via INTRAVENOUS

## 2021-12-11 MED ORDER — BUPIVACAINE LIPOSOME 1.3 % IJ SUSP: 20.0000 mL | Freq: Once | INTRAMUSCULAR | Status: DC

## 2021-12-11 MED ORDER — MIDAZOLAM HCL 2 MG/2ML IJ SOLN
INTRAMUSCULAR | Status: AC
  Filled 2021-12-11: qty 2

## 2021-12-11 MED ORDER — ONDANSETRON HCL 4 MG/2ML IJ SOLN
INTRAMUSCULAR | Status: DC | PRN
  Administered 2021-12-11: 4 mg via INTRAVENOUS

## 2021-12-11 MED ORDER — FENTANYL CITRATE (PF) 100 MCG/2ML IJ SOLN
INTRAMUSCULAR | Status: AC
  Filled 2021-12-11: qty 2

## 2021-12-11 MED ORDER — BUPIVACAINE HCL (PF) 0.5 % IJ SOLN
INTRAMUSCULAR | Status: AC
  Filled 2021-12-11: qty 30

## 2021-12-11 MED ORDER — MIDAZOLAM HCL 5 MG/5ML IJ SOLN
INTRAMUSCULAR | Status: DC | PRN
  Administered 2021-12-11: 2 mg via INTRAVENOUS

## 2021-12-11 MED ORDER — DEXAMETHASONE SODIUM PHOSPHATE 4 MG/ML IJ SOLN
INTRAMUSCULAR | Status: DC | PRN
  Administered 2021-12-11: 5 mg via INTRAVENOUS

## 2021-12-11 MED ORDER — HYDROCODONE-ACETAMINOPHEN 5-325 MG PO TABS: 1.0000 | ORAL_TABLET | Freq: Four times a day (QID) | ORAL | 0 refills | Status: DC | PRN

## 2021-12-11 MED ORDER — OXYCODONE HCL 5 MG/5ML PO SOLN: 5.0000 mg | Freq: Once | ORAL | Status: AC | PRN

## 2021-12-11 MED ORDER — ROCURONIUM BROMIDE 100 MG/10ML IV SOLN
INTRAVENOUS | Status: DC | PRN
  Administered 2021-12-11: 40 mg via INTRAVENOUS

## 2021-12-11 MED ORDER — BUPIVACAINE LIPOSOME 1.3 % IJ SUSP
INTRAMUSCULAR | Status: DC | PRN
  Administered 2021-12-11: 15 mL

## 2021-12-11 MED ORDER — PROPOFOL 10 MG/ML IV BOLUS
INTRAVENOUS | Status: DC | PRN
  Administered 2021-12-11: 160 mg via INTRAVENOUS

## 2021-12-11 SURGICAL SUPPLY — 49 items
ADH SKN CLS APL DERMABOND .7 (GAUZE/BANDAGES/DRESSINGS) ×1
APL SKNCLS STERI-STRIP NONHPOA (GAUZE/BANDAGES/DRESSINGS)
BENZOIN TINCTURE PRP APPL 2/3 (GAUZE/BANDAGES/DRESSINGS) IMPLANT
BLADE SURG 15 STRL LF DISP TIS (BLADE) ×1 IMPLANT
BLADE SURG 15 STRL SS (BLADE) ×2
CANISTER SUCT 1200ML W/VALVE (MISCELLANEOUS) IMPLANT
CLEANER CAUTERY TIP 5X5 PAD (MISCELLANEOUS) ×1 IMPLANT
COVER BACK TABLE 60X90IN (DRAPES) ×2 IMPLANT
COVER MAYO STAND STRL (DRAPES) ×2 IMPLANT
DERMABOND ADVANCED (GAUZE/BANDAGES/DRESSINGS) ×1
DERMABOND ADVANCED .7 DNX12 (GAUZE/BANDAGES/DRESSINGS) IMPLANT
DRAPE LAPAROTOMY 100X72 PEDS (DRAPES) ×1 IMPLANT
DRSG TEGADERM 2-3/8X2-3/4 SM (GAUZE/BANDAGES/DRESSINGS) IMPLANT
ELECT REM PT RETURN 9FT ADLT (ELECTROSURGICAL) ×2
ELECTRODE REM PT RTRN 9FT ADLT (ELECTROSURGICAL) ×1 IMPLANT
GAUZE SPONGE 4X4 12PLY STRL LF (GAUZE/BANDAGES/DRESSINGS) ×2 IMPLANT
GLOVE BIO SURGEON STRL SZ8 (GLOVE) ×2 IMPLANT
GOWN STRL REUS W/ TWL LRG LVL3 (GOWN DISPOSABLE) ×1 IMPLANT
GOWN STRL REUS W/ TWL XL LVL3 (GOWN DISPOSABLE) ×1 IMPLANT
GOWN STRL REUS W/TWL LRG LVL3 (GOWN DISPOSABLE) ×2
GOWN STRL REUS W/TWL XL LVL3 (GOWN DISPOSABLE) ×2
NDL HYPO 25X1 1.5 SAFETY (NEEDLE) ×1 IMPLANT
NDL HYPO 27GX1-1/4 (NEEDLE) IMPLANT
NEEDLE HYPO 25X1 1.5 SAFETY (NEEDLE) ×2 IMPLANT
NEEDLE HYPO 27GX1-1/4 (NEEDLE) IMPLANT
NS IRRIG 1000ML POUR BTL (IV SOLUTION) ×1 IMPLANT
PACK BASIN DAY SURGERY FS (CUSTOM PROCEDURE TRAY) ×2 IMPLANT
PAD CLEANER CAUTERY TIP 5X5 (MISCELLANEOUS) ×1
PENCIL SMOKE EVACUATOR (MISCELLANEOUS) ×2 IMPLANT
SPIKE FLUID TRANSFER (MISCELLANEOUS) IMPLANT
SPONGE GAUZE 2X2 8PLY STRL LF (GAUZE/BANDAGES/DRESSINGS) ×4 IMPLANT
STRIP CLOSURE SKIN 1/2X4 (GAUZE/BANDAGES/DRESSINGS) IMPLANT
SUT ETHILON 3 0 FSL (SUTURE) IMPLANT
SUT ETHILON 5 0 PS 2 18 (SUTURE) IMPLANT
SUT MNCRL AB 4-0 PS2 18 (SUTURE) ×1 IMPLANT
SUT SILK 2 0 SH (SUTURE) ×1 IMPLANT
SUT VIC AB 3-0 SH 27 (SUTURE)
SUT VIC AB 3-0 SH 27X BRD (SUTURE) IMPLANT
SUT VIC AB 4-0 SH 18 (SUTURE) ×2 IMPLANT
SUT VIC AB 5-0 PS2 18 (SUTURE) IMPLANT
SUT VICRYL 0 UR6 27IN ABS (SUTURE) ×1 IMPLANT
SUT VICRYL 3-0 CR8 SH (SUTURE) IMPLANT
SYR BULB EAR ULCER 3OZ GRN STR (SYRINGE) ×2 IMPLANT
SYR CONTROL 10ML LL (SYRINGE) ×2 IMPLANT
TOWEL GREEN STERILE FF (TOWEL DISPOSABLE) ×2 IMPLANT
TRAY DSU PREP LF (CUSTOM PROCEDURE TRAY) ×2 IMPLANT
TUBE CONNECTING 20X1/4 (TUBING) IMPLANT
UNDERPAD 30X36 HEAVY ABSORB (UNDERPADS AND DIAPERS) IMPLANT
YANKAUER SUCT BULB TIP NO VENT (SUCTIONS) IMPLANT

## 2021-12-11 NOTE — Anesthesia Preprocedure Evaluation (Addendum)
Anesthesia Evaluation  Patient identified by MRN, date of birth, ID band Patient awake    Reviewed: Allergy & Precautions, NPO status , Patient's Chart, lab work & pertinent test results  Airway Mallampati: I  TM Distance: >3 FB Neck ROM: Full    Dental  (+) Teeth Intact, Dental Advisory Given   Pulmonary Current Smoker,    breath sounds clear to auscultation       Cardiovascular negative cardio ROS   Rhythm:Regular Rate:Normal     Neuro/Psych PSYCHIATRIC DISORDERS Anxiety Depression negative neurological ROS     GI/Hepatic negative GI ROS, Neg liver ROS,   Endo/Other  negative endocrine ROS  Renal/GU negative Renal ROS     Musculoskeletal negative musculoskeletal ROS (+)   Abdominal Normal abdominal exam  (+)   Peds  Hematology negative hematology ROS (+)   Anesthesia Other Findings   Reproductive/Obstetrics                            Anesthesia Physical Anesthesia Plan  ASA: 2  Anesthesia Plan: General   Post-op Pain Management:    Induction: Intravenous  PONV Risk Score and Plan: 3 and Ondansetron, Dexamethasone and Midazolam  Airway Management Planned: Oral ETT  Additional Equipment: None  Intra-op Plan:   Post-operative Plan: Extubation in OR  Informed Consent: I have reviewed the patients History and Physical, chart, labs and discussed the procedure including the risks, benefits and alternatives for the proposed anesthesia with the patient or authorized representative who has indicated his/her understanding and acceptance.     Dental advisory given  Plan Discussed with: CRNA  Anesthesia Plan Comments:        Anesthesia Quick Evaluation

## 2021-12-11 NOTE — Anesthesia Procedure Notes (Signed)
Procedure Name: Intubation Date/Time: 12/11/2021 11:07 AM  Performed by: Cleda Clarks, CRNAPre-anesthesia Checklist: Patient identified, Emergency Drugs available, Suction available and Patient being monitored Patient Re-evaluated:Patient Re-evaluated prior to induction Oxygen Delivery Method: Circle system utilized Preoxygenation: Pre-oxygenation with 100% oxygen Induction Type: IV induction Ventilation: Mask ventilation without difficulty Laryngoscope Size: Miller and 2 Grade View: Grade II Tube type: Oral Tube size: 7.0 mm Number of attempts: 1 Airway Equipment and Method: Stylet and Oral airway Placement Confirmation: ETT inserted through vocal cords under direct vision, positive ETCO2 and breath sounds checked- equal and bilateral Secured at: 21 cm Tube secured with: Tape Dental Injury: Teeth and Oropharynx as per pre-operative assessment

## 2021-12-11 NOTE — Anesthesia Postprocedure Evaluation (Signed)
Anesthesia Post Note  Patient: Public house manager  Procedure(s) Performed: EXCISION MASS of abdominal wall (Abdomen)     Patient location during evaluation: PACU Anesthesia Type: General Level of consciousness: awake and alert Pain management: pain level controlled Vital Signs Assessment: post-procedure vital signs reviewed and stable Respiratory status: spontaneous breathing, nonlabored ventilation, respiratory function stable and patient connected to nasal cannula oxygen Cardiovascular status: blood pressure returned to baseline and stable Postop Assessment: no apparent nausea or vomiting Anesthetic complications: no   No notable events documented.  Last Vitals:  Vitals:   12/11/21 1300 12/11/21 1317  BP: 100/68 105/67  Pulse: 66 80  Resp: 19 16  Temp:  36.5 C  SpO2: 94% 97%    Last Pain:  Vitals:   12/11/21 1315  TempSrc:   PainSc: 4                  Shelton Silvas

## 2021-12-11 NOTE — Interval H&P Note (Signed)
History and Physical Interval Note:  12/11/2021 10:51 AM  Alexandria Gutierrez  has presented today for surgery, with the diagnosis of probable endometroma.  The various methods of treatment have been discussed with the patient and family. After consideration of risks, benefits and other options for treatment, the patient has consented to  Procedure(s): EXCISION MASS of abdominal wall (N/A) as a surgical intervention.  The patient's history has been reviewed, patient examined, no change in status, stable for surgery.  I have reviewed the patient's chart and labs.  Questions were answered to the patient's satisfaction.     Valarie Merino

## 2021-12-11 NOTE — Discharge Instructions (Signed)
  Post Anesthesia Home Care Instructions  Activity: Get plenty of rest for the remainder of the day. A responsible individual must stay with you for 24 hours following the procedure.  For the next 24 hours, DO NOT: -Drive a car -Operate machinery -Drink alcoholic beverages -Take any medication unless instructed by your physician -Make any legal decisions or sign important papers.  Meals: Start with liquid foods such as gelatin or soup. Progress to regular foods as tolerated. Avoid greasy, spicy, heavy foods. If nausea and/or vomiting occur, drink only clear liquids until the nausea and/or vomiting subsides. Call your physician if vomiting continues.  Special Instructions/Symptoms: Your throat may feel dry or sore from the anesthesia or the breathing tube placed in your throat during surgery. If this causes discomfort, gargle with warm salt water. The discomfort should disappear within 24 hours.  If you had a scopolamine patch placed behind your ear for the management of post- operative nausea and/or vomiting:  1. The medication in the patch is effective for 72 hours, after which it should be removed.  Wrap patch in a tissue and discard in the trash. Wash hands thoroughly with soap and water. 2. You may remove the patch earlier than 72 hours if you experience unpleasant side effects which may include dry mouth, dizziness or visual disturbances. 3. Avoid touching the patch. Wash your hands with soap and water after contact with the patch.    Information for Discharge Teaching: EXPAREL (bupivacaine liposome injectable suspension)   Your surgeon or anesthesiologist gave you EXPAREL(bupivacaine) to help control your pain after surgery.  EXPAREL is a local anesthetic that provides pain relief by numbing the tissue around the surgical site. EXPAREL is designed to release pain medication over time and can control pain for up to 72 hours. Depending on how you respond to EXPAREL, you may require  less pain medication during your recovery.  Possible side effects: Temporary loss of sensation or ability to move in the area where bupivacaine was injected. Nausea, vomiting, constipation Rarely, numbness and tingling in your mouth or lips, lightheadedness, or anxiety may occur. Call your doctor right away if you think you may be experiencing any of these sensations, or if you have other questions regarding possible side effects.  Follow all other discharge instructions given to you by your surgeon or nurse. Eat a healthy diet and drink plenty of water or other fluids.  If you return to the hospital for any reason within 96 hours following the administration of EXPAREL, it is important for health care providers to know that you have received this anesthetic. A teal colored band has been placed on your arm with the date, time and amount of EXPAREL you have received in order to alert and inform your health care providers. Please leave this armband in place for the full 96 hours following administration, and then you may remove the band.  

## 2021-12-11 NOTE — Transfer of Care (Signed)
Immediate Anesthesia Transfer of Care Note  Patient: Adeli Shine Sangalang  Procedure(s) Performed: EXCISION MASS of abdominal wall (Abdomen)  Patient Location: PACU  Anesthesia Type:General  Level of Consciousness: awake, alert  and oriented  Airway & Oxygen Therapy: Patient Spontanous Breathing and Patient connected to nasal cannula oxygen  Post-op Assessment: Report given to RN and Post -op Vital signs reviewed and stable  Post vital signs: Reviewed and stable  Last Vitals:  Vitals Value Taken Time  BP 157/136 12/11/21 1217  Temp    Pulse 85 12/11/21 1219  Resp 18 12/11/21 1219  SpO2 99 % 12/11/21 1219  Vitals shown include unvalidated device data.  Last Pain:  Vitals:   12/11/21 0826  TempSrc: Oral  PainSc: 3       Patients Stated Pain Goal: 5 (12/11/21 0826)  Complications: No notable events documented.

## 2021-12-11 NOTE — Op Note (Signed)
Alexandria Gutierrez  [birth year]   12/11/2021    PCP:  Carmel Sacramento, NP   Surgeon: Wenda Low, MD, FACS  Asst:  None  Anes:  General  Preop Dx: Endometriosis with endometrioma in the Pfannenstiel incision Postop Dx: Same path pending  Procedure: Excision of large marble sized endometrioma Location Surgery: [facility] day [facility] room 1 Complications: None noted  EBL:   Minimal cc  Drains: None  Description of Procedure:  The patient was taken to OR 1.  After anesthesia was administered and the patient was prepped  with ChloraPrep and a timeout was performed.  I had previously marked this with the patient.  It lay slightly off to the right of midline.  I elected to go through the Pfannenstiel incision and then dissected my way up around this.  It was firm and encapsulated and I tried to stay within that capsule.  Anterior portion was dissected free from the surrounding tissue with Metzenbaum scissors.  The posterior margin however was stuck to the anterior fascia of the rectus and and so that part came off with sharp dissection using the Metzenbaum as well as the Bovie.  It came out and I put a suture on the posterior margin through the portion of muscles that I excised with the mass.  I did not see any obvious gross endometrioma.  Sent the specimen for permanent sections.  There was a resultant defect in the in the anterior sheath of the rectus and I elected to close that with interrupted 0 Vicryl's placed in a figure-of-eight fashion.  I injected this area with Exparel approximately 15 cc.  I then closed with 4-0 Vicryl subcutaneously and subcuticularly and then Dermabond was used on the skin.  The patient tolerated the procedure well and was taken to the PACU in stable condition.     Matt B. Daphine Deutscher, MD, [facility], [place] [phone]

## 2021-12-14 ENCOUNTER — Encounter (HOSPITAL_BASED_OUTPATIENT_CLINIC_OR_DEPARTMENT_OTHER): Payer: Self-pay | Admitting: Surgery

## 2021-12-14 LAB — SURGICAL PATHOLOGY

## 2022-04-05 LAB — HM MAMMOGRAPHY

## 2022-07-10 ENCOUNTER — Telehealth (HOSPITAL_COMMUNITY): Payer: Self-pay | Admitting: Psychiatry

## 2022-07-10 ENCOUNTER — Encounter (HOSPITAL_COMMUNITY): Payer: Self-pay | Admitting: Psychiatry

## 2022-07-10 ENCOUNTER — Ambulatory Visit (HOSPITAL_BASED_OUTPATIENT_CLINIC_OR_DEPARTMENT_OTHER): Payer: 59 | Admitting: Psychiatry

## 2022-07-10 ENCOUNTER — Ambulatory Visit (HOSPITAL_COMMUNITY): Payer: 59 | Admitting: Psychiatry

## 2022-07-10 DIAGNOSIS — F410 Panic disorder [episodic paroxysmal anxiety] without agoraphobia: Secondary | ICD-10-CM

## 2022-07-10 DIAGNOSIS — F1611 Hallucinogen abuse, in remission: Secondary | ICD-10-CM

## 2022-07-10 DIAGNOSIS — F17200 Nicotine dependence, unspecified, uncomplicated: Secondary | ICD-10-CM

## 2022-07-10 DIAGNOSIS — Z79899 Other long term (current) drug therapy: Secondary | ICD-10-CM | POA: Insufficient documentation

## 2022-07-10 DIAGNOSIS — F17210 Nicotine dependence, cigarettes, uncomplicated: Secondary | ICD-10-CM

## 2022-07-10 DIAGNOSIS — F1521 Other stimulant dependence, in remission: Secondary | ICD-10-CM

## 2022-07-10 DIAGNOSIS — F19982 Other psychoactive substance use, unspecified with psychoactive substance-induced sleep disorder: Secondary | ICD-10-CM

## 2022-07-10 DIAGNOSIS — F1411 Cocaine abuse, in remission: Secondary | ICD-10-CM

## 2022-07-10 DIAGNOSIS — E559 Vitamin D deficiency, unspecified: Secondary | ICD-10-CM

## 2022-07-10 DIAGNOSIS — F1290 Cannabis use, unspecified, uncomplicated: Secondary | ICD-10-CM | POA: Insufficient documentation

## 2022-07-10 DIAGNOSIS — F411 Generalized anxiety disorder: Secondary | ICD-10-CM

## 2022-07-10 DIAGNOSIS — F332 Major depressive disorder, recurrent severe without psychotic features: Secondary | ICD-10-CM

## 2022-07-10 DIAGNOSIS — E538 Deficiency of other specified B group vitamins: Secondary | ICD-10-CM

## 2022-07-10 HISTORY — DX: Other long term (current) drug therapy: Z79.899

## 2022-07-10 MED ORDER — SERTRALINE HCL 100 MG PO TABS: 100.0000 mg | ORAL_TABLET | Freq: Every day | ORAL | 1 refills | Status: DC

## 2022-07-10 MED ORDER — MIRTAZAPINE 15 MG PO TABS: 15.0000 mg | ORAL_TABLET | Freq: Every day | ORAL | 1 refills | Status: DC

## 2022-07-10 NOTE — Telephone Encounter (Signed)
Contacted patient to schedule follow up appointment from new patient appointment today. She was unable to schedule due to not having work calendar. Stated she would call the [facility] office on Monday to schedule and that she had the office number.

## 2022-07-10 NOTE — Progress Notes (Signed)
Psychiatric Initial Adult Assessment  Patient Identification: Alexandria Gutierrez MRN:  [id number] Date of Evaluation:  07/10/2022 Referral Source: OB  Assessment:  Alexandria Gutierrez is a 42 y.o. 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  Patient reports worsening symptoms of depression and anxiety in the setting of discontinuing medications several months ago and and getting back on Zoloft.  She denies a trauma history but when she was in primary school ended up dropping out of high school because she did not care and was able to get her GED.  Her past substance use history would be suggestive of a prior trauma history but she may not have insight into this or not feel comfortable sharing this.  This is purely a hypothesis at this point.  She describes past intravenous use of heroin, methamphetamine as well as smoked in the past and this was her only period of sleeplessness.  She also has a history of cocaine and hallucinogenic mushroom use.  More chronic history of cannabis use and current use is typified by smoking THC-a and cites main benefit is for trying to sleep at night.  She was counseled on trying to cut back and cut out as this can impair sleep architecture and make anxiety and depression worse.  She is not having suicidal ideation but more of the sensation of family being off if she were not physically present.  Significant guilt sensations around not being a good mother or wife.  She was encouraged to reach out to her insurer to find a therapist that is in network with her.  He has an extremely poor  nutrition base at this point with history of vitamin B12 and D deficiencies in the past.  Will coordinate with OB while she is trying to find a PCP to repeat levels and get a nutrition referral.  She has been on long-term clonazepam over the last 2 years and uses about 3 times per week but has had periods where she does not use consistently.  Discussed long-term risks of benzodiazepine use and she was amenable to stopping this and instead trying to get more effective standing medication.  She did not benefit from [place] and has had middling responses to SSRIs to date but this is in the context of sustained substance use so we will try to cut out cannabis to get a more effective evaluation of Zoloft but will add Remeron to assist with poor appetite and insomnia in the meantime.  She was never diagnosed with attention deficit spectrum disorder and with significant substance use history have lower suspicion that ADD would be the most accurate diagnosis despite difficulties in maintaining attention at work.  Of note she was able to concentrate with fully adequate attention in interview today and imagine that combination of undertreated mood disorders and concurrent substance use/benzodiazepine use is largely accounting for difficulties with attention combined with poor sleep and poor nutrition.  Follow-up in 1 month.  For safety, her acute risk factors for suicide are: Substance use, current diagnosis of depression.  Her chronic risk factors for suicide are: 2 past suicide attempts, chronic mental illness, past substance use, history of treatment noncompliance.  Her protective factors are: Employment, minor  children living in the home, supportive family, actively seeking and engaging with mental health care, no suicidal ideation, hope for the future.  While future events cannot be fully predicted, she does not currently meet IVC criteria and can be continued as an outpatient.  Plan:  # Major depressive disorder,  recurrent, severe without psychotic features with 2 lifetime suicide attempts Past medication trials: See med trials below Status of problem: New to provider Interventions: --Continue Zoloft 100 mg once daily --Start Remeron 15 mg nightly (s2/17/24) --Coordinate with PCP to get TSH, total T3, free T4  # Generalized anxiety disorder with panic attacks  long-term current use of benzodiazepines Past medication trials:  Status of problem: New to provider Interventions: -- Discontinue clonazepam 1 mg 2 times daily as needed  # Cannabis use disorder rule out substance-induced mood disorder Past medication trials:  Status of problem: New to provider Interventions: -- Continue to encourage abstinence  # Drug-induced insomnia Past medication trials:  Status of problem: New to provider Interventions: -- Remeron as above --Cutting back on caffeine and cannabis use as above  # Tobacco use disorder Past medication trials:  Status of problem: New to provider Interventions: -- Patient is declining nicotine replacement therapy at this time --Tobacco cessation counseling provided  # Chronic poor appetite with history of vitamin D deficiency and B12 deficiency Past medication trials:  Status of problem: New to provider Interventions: -- Coordinate with PCP to get nutrition referral, repeat vitamin D, B12, iron level, magnesium, phosphorus, amylase, CMP  # History of cocaine/methamphetamine/hallucinogenic mushroom 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   Subjective:  Chief Complaint:  Chief Complaint  Patient presents with   Anxiety   Depression   Drug / Alcohol Assessment   Establish Care   Insomnia     History of Present Illness:  Has been doing therapy and medications on and off entire adult life and finding things are spiraling now. Has been several years since she has talked to someone. Stopped taking sertraline 4 months ago and then restarted. In a dark place and doesn't know what to do.   Lives with husband and two of her children, 6 and 9. 2 cats. 73 year old daughter lives with her parents. Everyone getting along. Used to like to craft but hasn't been doing anything for fun lately. Not sleeping well, always feeling tired but not getting rested. Trouble falling asleep and staying asleep. No dreams that she can remember. Lightly snores and grinds teeth. No night guard. Half a pot of coffee daily; last at lunch time. Appetite is poor, average is one meal per day but now it isn't even one meal per day. Comes home for lunch at eats 5 oreos and goes back to work. When she returns from work doesn't usually eat. No breakfast. No binges. No intentional restriction. No purging. Doesn't think she can focus (is doing fine in interview with this). When she has to set up for a procedure will need 2 hrs to do it because she will mess up and finding she is asking doctor she works with questions constantly and getting things wrong. Thinks this is a lifelong issue. Did poorly in school and dropped off in middle of high school but did get GED and found the  main issue was she didn't care. Fidgety. Struggles with guilt feelings. No SI at present, in the past has been. 2 suicide attempts with last one in 2008. Thoughts at present more about family would be better off if she weren't physically present. A better wife and mom could be found, not that she would be dead.   Chronic worry across multiple domains with impact on sleep and muscle tension. Panic attacks have slowed down from last year when it was once per week. Hasn't been that severe in 6 months. Most of the time would like to not be in crowds. Longest period of  sleeplessness was 3-4 days. This was 3 years ago and methamphetamine use was present. At 12 or 13 was diagnosed with situational insomnia which she describes as a change in routine leading to insomnia where she will need to stay awake for 24hrs to recalibrate. Would usually happen at breaks/vacation. No hallucinations. No paranoia.   Has a lot of alcoholics in her family. If she does will have 2 drinks at a time and less than twice per month. Smokes 5 cigarettes per day. Not interested in NRT at this time. Has been able to cut back from 1ppd. Marijuana use at present, THC-A and purchased as flower. Smoking everyday after work and after children are in bed, to go to sleep. Smoked methamphetamine as above. Went to rehab in 2009, shot heroin, snorted cocaine, mushrooms as well. Last meth use was 3 years ago and 6 weeks.   Has a tendency to stop using medication on the weekends. Clonazepam still current but taking PRN and will be about 3x per week; has been on for 2 years. Hasn't been able to keep a PCP for more than a year. Works for [employer].   Past Psychiatric History:  Diagnoses: Cocaine/methamphetamine/mushroom use disorder in sustained remission, recurrent depression, anxiety disorder unspecified, insomnia, cannabis use disorder, tobacco use disorder Medication trials: zoloft, prozac, lexapro, effexor, vraylar (ineffective), clonazepam  Previous psychiatrist/therapist: yes, both. Mickel Baas at [facility] Hospitalizations: twice as below Suicide attempts: 2 suicide attempts with last one in 2008. First overdose on tylenol PM and got in bathub full of water. Second was kitchen cleaner overdose. SIB: bites nails and picks at scabs. Scratches self in sleep Hx of violence towards others: none Current access to guns: none Hx of abuse: none  Previous Psychotropic Medications: Yes   Substance Abuse History in the last 12 months:  Yes.    Past Medical History:  Past Medical  History:  Diagnosis Date   Abnormal finding on Pap smear 01/2011   HSIL   Anxiety    Chicken pox    Depression    History of blood transfusion 01/16/2016   HSIL (high grade squamous intraepithelial lesion) on Pap smear 04/12/2011   Postpartum care following vaginal delivery (08/04/13) 08/04/2013   SVD (spontaneous vaginal delivery) 08/04/2013    Past Surgical History:  Procedure Laterality Date   CESAREAN SECTION N/A 01/16/2016   Procedure: CESAREAN SECTION;  Surgeon: Janyth Pupa, DO;  Location: [facility];  Service: Obstetrics;  Laterality: N/A;   CYSTECTOMY Right 37 or 42 years old   on wrist   MASS EXCISION N/A 12/11/2021   Procedure: EXCISION MASS of abdominal wall;  Surgeon: Johnathan Hausen, MD;  Location: [facility];  Service: General;  Laterality: N/A;   WISDOM TOOTH EXTRACTION      Family Psychiatric History: mother with anxiety/depression. Maternal aunt with bipolar/paranoid schizophrenia.  Family History:  Family History  Problem Relation Age of Onset   Alcohol abuse Maternal Aunt    Mental illness Maternal Aunt    Endometriosis Mother    Heart disease Maternal Grandmother    Heart disease Maternal Grandfather    Stroke Paternal Grandfather    Colon cancer Neg Hx    Esophageal cancer Neg Hx    Rectal cancer Neg Hx     Social History:   Social History   Socioeconomic History   Marital status: Married    Spouse name: Not on file   Number of children: 3   Years of education: Not on file   Highest education level: Not on file  Occupational History   Occupation: CMA  Tobacco Use   Smoking status: Every Day    Packs/day: 0.25    Years: 22.00    Total pack years: 5.50    Types: Cigarettes   Smokeless tobacco: Never   Tobacco comments:    5 cigarettes/day as of 07/10/2022  Vaping Use   Vaping Use: Never used  Substance and Sexual Activity   Alcohol use: Yes    Comment: 2 units of alcohol twice per month   Drug use: Yes    Types:  Marijuana, Psilocybin, Methamphetamines, Cocaine, Heroin    Comment: daily THC-a smokes nightly.  Past IV heroin, out methamphetamine/cocaine-mushroom use   Sexual activity: Yes    Partners: Male    Birth control/protection: I.U.D.    Comment: pregnant  Other Topics Concern   Not on file  Social History Narrative   Not on file   Social Determinants of Health   Financial Resource Strain: Not on file  Food Insecurity: Not on file  Transportation Needs: Not on file  Physical Activity: Not on file  Stress: Not on file  Social Connections: Not on file    Additional Social History: See HPI  Allergies:   Allergies  Allergen Reactions   Erythromycin Nausea And Vomiting    Spikes high fever to 103   Horse-Derived Products Swelling    Horses    Current Medications: Current Outpatient Medications  Medication Sig Dispense Refill   mirtazapine (REMERON) 15 MG tablet Take 1 tablet (15 mg total) by mouth at bedtime. 30 tablet 1   levonorgestrel (MIRENA) 20 MCG/24HR IUD 1 each once by Intrauterine route.     sertraline (ZOLOFT) 100 MG tablet Take 1 tablet (100 mg total) by mouth daily. 30 tablet 1   No current facility-administered medications for this visit.    ROS: Review of Systems  Constitutional:  Positive for appetite change. Negative for unexpected weight change.  Gastrointestinal:  Positive for constipation and diarrhea. Negative for nausea and vomiting.  Endocrine: Negative for polyphagia.  Skin:        No hair loss  Neurological:  Positive for headaches. Negative for dizziness.  Psychiatric/Behavioral:  Positive for decreased concentration, dysphoric mood, self-injury and sleep disturbance. Negative for hallucinations and suicidal ideas. The patient is nervous/anxious and is hyperactive.     Objective:  Psychiatric Specialty Exam: There were no vitals taken for this visit.There is no height or weight on file to calculate BMI.  General Appearance: Casual, Fairly  Groomed, and wearing glasses and appears stated age.  Tattoos present  Eye Contact:  Good  Speech:  Clear and Coherent and Normal Rate  Volume:  Normal  Mood:  Anxious and Depressed  Affect:  Appropriate, Congruent, Depressed, and anxious  Thought Content: Logical and Hallucinations: None  Suicidal Thoughts:   No but having thoughts if she were not physically present with her family and they would be better off.  Specifically not dead  Homicidal Thoughts:  No  Thought Process:  Coherent, Goal Directed, and Linear  Orientation:  Full (Time, Place, and Person)    Memory:  Immediate;   Fair Recent;   Fair Remote;   Fair  Judgment:  Fair  Insight:  Shallow  Concentration:  Concentration: Good and Attention Span: Good  Recall:  [employer] of Knowledge: Fair  Language: Fair  Psychomotor Activity:  Increased, Restlessness, and picking at arms  Akathisia:  No  AIMS (if indicated): not done  Assets:  Communication Skills Desire for Improvement Financial Resources/Insurance Housing Intimacy Leisure Time Physical Health Resilience Social Support Talents/Skills Transportation Vocational/Educational  ADL's:  Intact  Cognition: WNL  Sleep:  Poor   PE: General: sits comfortably in view of camera; no acute distress  Pulm: no increased work of breathing on room air  MSK: all extremity movements appear intact  Neuro: no focal neurological deficits observed  Gait & Station: unable to assess by video    Metabolic Disorder Labs: No results found for: "HGBA1C", "MPG" No results found for: "PROLACTIN" No results found for: "CHOL", "TRIG", "HDL", "CHOLHDL", "VLDL", "LDLCALC" Lab Results  Component Value Date   TSH 0.50 07/29/2017    Therapeutic Level Labs: No results found for: "LITHIUM" No results found for: "CBMZ" No results found for: "VALPROATE"  Screenings:  [facility] Office Visit from 07/10/2022 in [facility]  Visit from 07/29/2017 in [facility]  PHQ-2 Total Score 6 4  PHQ-9 Total Score 23 14      [facility] Office Visit from 07/10/2022 in [facility] Admission (Discharged) from 12/11/2021 in [facility] ED from 07/16/2021 in [facility] Emergency Department at [place] No Risk No Risk No Risk       Collaboration of Care: Collaboration of Care: Medication Management AEB as above, Primary Care Provider AEB needs to establish with new PCP, and Referral or follow-up with counselor/therapist AEB patient to call insurer to find a therapist in network  Patient/Guardian was advised Release of Information must be obtained prior to any record release in order to collaborate their care with an outside provider. Patient/Guardian was advised if they have not already done so to contact the registration department to sign all necessary forms in order for [place] to release information regarding their care.   Consent: Patient/Guardian gives verbal consent for treatment and assignment of benefits for services provided during this visit. Patient/Guardian expressed understanding and agreed to proceed.   Televisit via video: I connected with Arilynn Shine Trimmer on 07/10/22 at  9:00 AM EST by a video enabled telemedicine application and verified that I am speaking with the correct person using two identifiers.  Location: Patient: home in [place] Provider: home office   I discussed the limitations of evaluation and management by telemedicine and the availability of in person appointments. The patient expressed understanding and agreed to proceed.  I discussed the assessment and treatment plan with the patient. The patient was provided an opportunity to ask questions and all were answered. The patient agreed with the plan and demonstrated an understanding of the instructions.   The patient was advised to call back  or seek an in-person evaluation if the symptoms worsen or if the condition fails to  improve as anticipated.  I provided 60 minutes of non-face-to-face time during this encounter.  Jacquelynn Cree, MD 2/17/202412:08 PM

## 2022-07-10 NOTE — Patient Instructions (Signed)
We added Remeron 15 mg nightly to your medications today.  Continue taking Zoloft 100 mg daily for now.  We will try to give both of these medications a better evaluation if you are able to cut back on the amount of THC-a that you are using.  While you are looking for a PCP, please coordinate with your OB doctor to get a vitamin D, B12, iron panel, CMP, amylase, phosphorus, magnesium, and a nutrition referral.  If you are able to get better nutrition and this should make it easier to concentrate as well as getting better sleep.  If her OB can also get a thyroid panel (TSH, total T3, free T4) this will help [place] rule out other physical processes to how you are feeling.  Contact your insurer to find who is in network for CBT therapy.  Keep up the good work with trying to cut back on amount of cigarettes you are smoking.

## 2022-08-10 ENCOUNTER — Telehealth (INDEPENDENT_AMBULATORY_CARE_PROVIDER_SITE_OTHER): Payer: 59 | Admitting: Psychiatry

## 2022-08-10 ENCOUNTER — Encounter (HOSPITAL_COMMUNITY): Payer: Self-pay | Admitting: Psychiatry

## 2022-08-10 DIAGNOSIS — Z79899 Other long term (current) drug therapy: Secondary | ICD-10-CM

## 2022-08-10 DIAGNOSIS — F1290 Cannabis use, unspecified, uncomplicated: Secondary | ICD-10-CM

## 2022-08-10 DIAGNOSIS — F332 Major depressive disorder, recurrent severe without psychotic features: Secondary | ICD-10-CM

## 2022-08-10 DIAGNOSIS — F17200 Nicotine dependence, unspecified, uncomplicated: Secondary | ICD-10-CM

## 2022-08-10 DIAGNOSIS — F411 Generalized anxiety disorder: Secondary | ICD-10-CM

## 2022-08-10 DIAGNOSIS — F410 Panic disorder [episodic paroxysmal anxiety] without agoraphobia: Secondary | ICD-10-CM

## 2022-08-10 DIAGNOSIS — F19982 Other psychoactive substance use, unspecified with psychoactive substance-induced sleep disorder: Secondary | ICD-10-CM

## 2022-08-10 MED ORDER — SERTRALINE HCL 100 MG PO TABS: 100.0000 mg | ORAL_TABLET | Freq: Every day | ORAL | 1 refills | Status: DC

## 2022-08-10 MED ORDER — MIRTAZAPINE 15 MG PO TABS: 15.0000 mg | ORAL_TABLET | Freq: Every day | ORAL | 1 refills | Status: DC

## 2022-08-10 NOTE — Patient Instructions (Signed)
We did not make any medication changes today.  Keep up the good work with trying to cut back on the amount of THC a that you are smoking and cigarettes.  Keep practicing the breathing techniques that we went over today.  When he can please try to establish with a PCP to get the blood work and nutrition referral that we talked about.

## 2022-08-10 NOTE — Progress Notes (Signed)
[place] MD Outpatient Progress Note  08/10/2022 1:34 PM [place]  MRN:  [id number]  Assessment:  Alexandria Gutierrez  presents for follow-up evaluation. Today, 08/10/22, patient reports significantly improved insomnia she is able to sleep 10 hours per night without issue.  Had an initial rough response to Remeron with sleeping 10 hours with 2 hours of sleep hangover the next morning but that has now resolved.  Interpersonally things are improving with her husband and this has led to a little more calm generally.  Took Klonopin twice between last appointment and now and while this will not be continued with new prescriptions she is taking it infrequently enough that when she runs out no risk of withdrawal.  She is still looking for a psychotherapist and has not had any of her blood work done or nutrition referral yet (still does not have a PCP).  Continues to smoke THC a every night and while she was able to have a 10-day period of abstinence found that the cravings were too much.  Still smoking about 5 cigarettes/day.  She is content with current doses as in plan below and we will follow-up in 2 months.     For safety, her acute risk factors for suicide are: Substance use, current diagnosis of depression.  Her chronic risk factors for suicide are: 2 past suicide attempts, chronic mental illness, past substance use, history of treatment noncompliance.  Her protective factors are: Employment, minor children living in the home, supportive family, actively seeking and engaging with mental health care, no suicidal ideation, hope for the future.  While future events cannot be fully predicted, she does not currently meet IVC criteria and can be continued as an outpatient.  Identifying Information: Alexandria Gutierrez is a 42 y.o. 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 Initial evaluation of anxiety and depression on 07/10/22; please see that note for full case formulation.  Patient reported worsening symptoms of depression and anxiety in the setting of discontinuing medications several months prior to first appointment and and getting back on Zoloft.  She denied a trauma history but when she was in primary school ended up dropping out of high school because she did not care and was able to get her GED.  Her past substance use history would be suggestive of a prior trauma history but she may not have insight into this or not feel comfortable sharing this.  This was purely a hypothesis.  She described past intravenous use of heroin, methamphetamine as well as smoked in the past and this was her only period of sleeplessness.  She also had a history of cocaine and hallucinogenic mushroom use.  More chronic history of cannabis use and current use is typified by smoking THC-a and cites main benefit is for trying to sleep at night.  Was not having suicidal ideation but more of the sensation of family being off if she were not physically present.  Significant guilt sensations around not being a good mother or wife.  Had an extremely poor nutrition base at time of initial visit with history of vitamin B12 and D deficiencies in the past.  Had been on long-term clonazepam over the last 2 years and  uses about 3 times per week but has had periods where she does not use consistently.  She did not benefit from [place] and had middling responses to SSRIs to date but this is in the context of sustained substance use so we will try to cut out cannabis to get a more effective evaluation of Zoloft but will add Remeron to assist with poor  appetite and insomnia in the meantime.  She was never diagnosed with attention deficit spectrum disorder and with significant substance use history have lower suspicion that ADD would be the most accurate diagnosis despite difficulties in maintaining attention at work.  Of note she was able to concentrate with fully adequate attention in initial interview and imagine that combination of undertreated mood disorders and concurrent substance use/benzodiazepine use was largely accounting for difficulties with attention combined with poor sleep and poor nutrition.     Plan:   # Major depressive disorder, recurrent, severe without psychotic features with 2 lifetime suicide attempts Past medication trials: See med trials below Status of problem: improving Interventions: --Continue Zoloft 100 mg once daily --Start Remeron 15 mg nightly (s2/17/24) --Coordinate with PCP to get TSH, total T3, free T4   # Generalized anxiety disorder with panic attacks  long-term current use of benzodiazepines Past medication trials:  Status of problem: improving Interventions: -- Discontinue clonazepam 1 mg 2 times daily as needed   # Cannabis use disorder rule out substance-induced mood disorder Past medication trials:  Status of problem: Chronic and stable Interventions: -- Continue to encourage abstinence   # Drug-induced insomnia Past medication trials:  Status of problem: improving Interventions: -- Remeron as above --Cutting back on caffeine and cannabis use as above   # Tobacco use disorder Past medication trials:  Status of problem: New to provider Interventions: -- Patient is declining nicotine replacement therapy at this time --Tobacco cessation counseling provided   # Chronic poor appetite with history of vitamin D deficiency and B12 deficiency Past medication trials:  Status of problem: improving Interventions: -- Coordinate with PCP to get nutrition referral, repeat vitamin D, B12, iron  level, magnesium, phosphorus, amylase, CMP   # History of cocaine/methamphetamine/hallucinogenic mushroom 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   Subjective:  Chief Complaint:  Chief Complaint  Patient presents with   Anxiety   Depression   Insomnia   Follow-up    Interval History: Has a stomach bug today, she and her son are home recovering from it. Thinks that lately things have been really good. The first two weeks with starting remeron were rough, she and her husband fought a lot. She is more active in her life, previously was hard to get up and do anything but the last two weeks this has more of less resolved. In the process of resolving differences between her and her husband this has led to calmer state. Sleeping good up until last night when she got stomach bug. The first two weeks she would be asleep for 10hrs within 17minutes and 2hrs of lag effect the next morning. Now much more normal bed routine and adjusted her chores to the morning because she has more energy. Has been eating non-stop and thinks she has gained between 10-15lbs. She is fine with this at the moment. Trying to  pick healthy choices. Still hasn't gotten bloodwork or new PCP yet. Caffeine is down to 2 cups of coffee in the morning. Nicotine still 5 cigarettes per day. Was able to cut back from [facility] for 10 days before going back to every night before bed. With discontinuing found that she was irritable and agitated; hard to wind down without it. Did deep breathing exercise with calming effect. Has taken klonopin twice since last appointment but no panic attacks.   Visit Diagnosis:    ICD-10-CM   1. Cannabis use disorder  F12.90     2. Long-term  current use of benzodiazepine  Z79.899     3. Major depressive disorder, recurrent severe without psychotic features (HCC)  F33.2 sertraline (ZOLOFT) 100 MG tablet    mirtazapine (REMERON) 15 MG tablet    4. Generalized anxiety disorder with panic attacks  F41.1 sertraline (ZOLOFT) 100 MG tablet   F41.0 mirtazapine (REMERON) 15 MG tablet    5. Smoker  F17.200     6. 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 Mickel Baas at [facility] Hospitalizations: twice as below Suicide attempts: 2 suicide attempts with last one in 2008. First overdose on tylenol PM and got in bathub full of water. Second was kitchen cleaner overdose. SIB: bites nails and picks at scabs. Scratches self in sleep Hx of violence towards others: none Current access to guns: none Hx of abuse: none Substance use: Marijuana use at present, THC-A and purchased as flower. Smoking everyday after work and after children are in bed, to go to sleep. Smoked methamphetamine as above. Went to rehab in 2009, shot heroin, snorted cocaine, mushrooms as well. Last meth use was 3 years ago and 6 weeks.   Past Medical History:  Past Medical History:  Diagnosis Date   Abnormal finding on Pap smear 01/2011   HSIL   Anxiety    Chicken pox    Depression    History of blood transfusion 01/16/2016   HSIL (high grade squamous intraepithelial lesion) on Pap smear 04/12/2011   Postpartum care following vaginal delivery (08/04/13)  08/04/2013   SVD (spontaneous vaginal delivery) 08/04/2013    Past Surgical History:  Procedure Laterality Date   CESAREAN SECTION N/A 01/16/2016   Procedure: CESAREAN SECTION;  Surgeon: Janyth Pupa, DO;  Location: [facility];  Service: Obstetrics;  Laterality: N/A;   CYSTECTOMY Right 8 or 42 years old   on wrist   MASS EXCISION N/A 12/11/2021   Procedure: EXCISION MASS of abdominal wall;  Surgeon: Johnathan Hausen, MD;  Location: [facility];  Service: General;  Laterality: N/A;   WISDOM TOOTH EXTRACTION      Family Psychiatric History: mother with anxiety/depression. Maternal aunt with bipolar/paranoid schizophrenia.   Family History:  Family History  Problem Relation Age of Onset   Alcohol abuse Maternal Aunt    Mental illness Maternal Aunt    Endometriosis Mother    Heart disease Maternal Grandmother    Heart disease Maternal Grandfather    Stroke Paternal Grandfather    Colon cancer Neg Hx    Esophageal cancer Neg Hx  Rectal cancer Neg Hx     Social History:  Social History   Socioeconomic History   Marital status: Married    Spouse name: Not on file   Number of children: 3   Years of education: Not on file   Highest education level: Not on file  Occupational History   Occupation: CMA  Tobacco Use   Smoking status: Every Day    Packs/day: 0.25    Years: 22.00    Additional pack years: 0.00    Total pack years: 5.50    Types: Cigarettes   Smokeless tobacco: Never   Tobacco comments:    5 cigarettes/day as of 07/10/2022  Vaping Use   Vaping Use: Never used  Substance and Sexual Activity   Alcohol use: Yes    Comment: 2 units of alcohol twice per month   Drug use: Yes    Types: Marijuana, Psilocybin, Methamphetamines, Cocaine, Heroin    Comment: daily THC-a smokes nightly.  Past IV heroin, out methamphetamine/cocaine-mushroom use   Sexual activity: Yes    Partners: Male    Birth control/protection: I.U.D.    Comment: pregnant   Other Topics Concern   Not on file  Social History Narrative   Not on file   Social Determinants of Health   Financial Resource Strain: Not on file  Food Insecurity: Not on file  Transportation Needs: Not on file  Physical Activity: Not on file  Stress: Not on file  Social Connections: Not on file    Allergies:  Allergies  Allergen Reactions   Erythromycin Nausea And Vomiting    Spikes high fever to 103   Horse-Derived Products Swelling    Horses    Current Medications: Current Outpatient Medications  Medication Sig Dispense Refill   levonorgestrel (MIRENA) 20 MCG/24HR IUD 1 each once by Intrauterine route.     mirtazapine (REMERON) 15 MG tablet Take 1 tablet (15 mg total) by mouth at bedtime. 30 tablet 1   sertraline (ZOLOFT) 100 MG tablet Take 1 tablet (100 mg total) by mouth daily. 30 tablet 1   No current facility-administered medications for this visit.    ROS: Review of Systems  Constitutional:  Positive for appetite change and unexpected weight change.  Psychiatric/Behavioral:  Positive for decreased concentration. Negative for dysphoric mood, sleep disturbance and suicidal ideas. The patient is nervous/anxious.     Objective:  Psychiatric Specialty Exam: There were no vitals taken for this visit.There is no height or weight on file to calculate BMI.  General Appearance: Casual, Fairly Groomed, and wearing glasses and appears stated age.  Tattoos present  Eye Contact:  Good  Speech:  Clear and Coherent and Normal Rate  Volume:  Normal  Mood:   "Good"  Affect:  Appropriate, Congruent, Full Range, and also anxious significantly less so than initial appointment  Thought Content: Logical and Hallucinations: None   Suicidal Thoughts:  No  Homicidal Thoughts:  No  Thought Process:  Coherent, Goal Directed, and Linear  Orientation:  Full (Time, Place, and Person)    Memory:  Immediate;   Good  Judgment:  Fair still struggling with THC use and tobacco  cessation  Insight:  Fair  Concentration:  Concentration: Fair  Recall:  [employer] of Knowledge: Fair  Language: Fair  Psychomotor Activity:  Normal  Akathisia:  No  AIMS (if indicated): not done  Assets:  Communication Skills Desire for Improvement 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  ADL's:  Intact  Cognition: WNL  Sleep:  Good   PE: General: sits comfortably in view of camera; no acute distress  Pulm: no increased work of breathing on room air  MSK: all extremity movements appear intact  Neuro: no focal neurological deficits observed  Gait & Station: unable to assess by video    Metabolic Disorder Labs: No results found for: "HGBA1C", "MPG" No results found for: "PROLACTIN" No results found for: "CHOL", "TRIG", "HDL", "CHOLHDL", "VLDL", "LDLCALC" Lab Results  Component Value Date   TSH 0.50 07/29/2017    Therapeutic Level Labs: No results found for: "LITHIUM" No results found for: "VALPROATE" No results found for: "CBMZ"  Screenings:  [facility] Office Visit from 07/10/2022 in [facility] Office Visit from 07/29/2017 in [facility] [place]  PHQ-2 Total Score 6 4  PHQ-9 Total Score 23 [place] Office Visit from 07/10/2022 in [facility] Admission (Discharged) from 12/11/2021 in [facility] from 07/16/2021 in [facility] [place] No Risk No Risk No Risk       Collaboration of Care: Collaboration of Care: Medication Management AEB as above, Primary Care Provider AEB as above, and Referral or follow-up with counselor/therapist AEB as above  Patient/Guardian was advised Release of Information must be obtained prior to any record release in order to  collaborate their care with an outside provider. Patient/Guardian was advised if they have not already done so to contact the registration department to sign all necessary forms in order for [place] to release information regarding their care.   Consent: Patient/Guardian gives verbal consent for treatment and assignment of benefits for services provided during this visit. Patient/Guardian expressed understanding and agreed to proceed.   Televisit via video: I connected with [place] on 08/10/22 at  1:00 PM EDT by a video enabled telemedicine application and verified that I am speaking with the correct person using two identifiers.  Location: Patient: home Provider: home office   I discussed the limitations of evaluation and management by telemedicine and the availability of in person appointments. The patient expressed understanding and agreed to proceed.  I discussed the assessment and treatment plan with the patient. The patient was provided an opportunity to ask questions and all were answered. The patient agreed with the plan and demonstrated an understanding of the instructions.   The patient was advised to call back or seek an in-person evaluation if the symptoms worsen or if the condition fails to improve as anticipated.  I provided 20 minutes of non-face-to-face time during this encounter.  Jacquelynn Cree, MD 08/10/2022, 1:34 PM

## 2022-09-22 ENCOUNTER — Emergency Department (HOSPITAL_COMMUNITY)
Admission: EM | Admit: 2022-09-22 | Discharge: 2022-09-22 | Disposition: A | Discharge status: Home / Self Care | Payer: 59 | Attending: Student | Admitting: Student

## 2022-09-22 ENCOUNTER — Other Ambulatory Visit: Payer: Self-pay

## 2022-09-22 ENCOUNTER — Encounter (HOSPITAL_COMMUNITY): Payer: Self-pay

## 2022-09-22 ENCOUNTER — Emergency Department (HOSPITAL_COMMUNITY): Payer: 59

## 2022-09-22 DIAGNOSIS — F17210 Nicotine dependence, cigarettes, uncomplicated: Secondary | ICD-10-CM | POA: Insufficient documentation

## 2022-09-22 DIAGNOSIS — R0602 Shortness of breath: Secondary | ICD-10-CM | POA: Insufficient documentation

## 2022-09-22 DIAGNOSIS — R0789 Other chest pain: Secondary | ICD-10-CM

## 2022-09-22 DIAGNOSIS — R062 Wheezing: Secondary | ICD-10-CM

## 2022-09-22 DIAGNOSIS — R079 Chest pain, unspecified: Secondary | ICD-10-CM | POA: Insufficient documentation

## 2022-09-22 LAB — COMPREHENSIVE METABOLIC PANEL
ALT: 15 U/L (ref 0–44)
AST: 16 U/L (ref 15–41)
Albumin: 4.4 g/dL (ref 3.5–5.0)
Alkaline Phosphatase: 60 U/L (ref 38–126)
Anion gap: 8 (ref 5–15)
BUN: 8 mg/dL (ref 6–20)
CO2: 26 mmol/L (ref 22–32)
Calcium: 9.3 mg/dL (ref 8.9–10.3)
Chloride: 105 mmol/L (ref 98–111)
Creatinine, Ser: 0.63 mg/dL (ref 0.44–1.00)
GFR, Estimated: >60 mL/min (ref >60)
Glucose, Bld: 98 mg/dL (ref 70–99)
Potassium: 3.8 mmol/L (ref 3.5–5.1)
Sodium: 139 mmol/L (ref 135–145)
Total Bilirubin: 0.5 mg/dL (ref 0.3–1.2)
Total Protein: 7.8 g/dL (ref 6.5–8.1)

## 2022-09-22 LAB — CBC WITH DIFFERENTIAL/PLATELET
Abs Immature Granulocytes: 0.02 10*3/uL (ref 0.00–0.07)
Basophils Absolute: 0.1 10*3/uL (ref 0.0–0.1)
Basophils Relative: 1 %
Eosinophils Absolute: 0.4 10*3/uL (ref 0.0–0.5)
Eosinophils Relative: 5 %
HCT: 42.5 % (ref 36.0–46.0)
Hemoglobin: 14.3 g/dL (ref 12.0–15.0)
Immature Granulocytes: 0 %
Lymphocytes Relative: 26 %
Lymphs Abs: 2.3 10*3/uL (ref 0.7–4.0)
MCH: 31.3 pg (ref 26.0–34.0)
MCHC: 33.6 g/dL (ref 30.0–36.0)
MCV: 93 fL (ref 80.0–100.0)
Monocytes Absolute: 0.9 10*3/uL (ref 0.1–1.0)
Monocytes Relative: 10 %
Neutro Abs: 5.2 10*3/uL (ref 1.7–7.7)
Neutrophils Relative %: 58 %
Platelets: 229 10*3/uL (ref 150–400)
RBC: 4.57 MIL/uL (ref 3.87–5.11)
RDW: 12.7 % (ref 11.5–15.5)
WBC: 8.9 10*3/uL (ref 4.0–10.5)
nRBC: 0 % (ref 0.0–0.2)

## 2022-09-22 LAB — I-STAT BETA HCG BLOOD, ED (MC, WL, AP ONLY): I-stat hCG, quantitative: <5 m[IU]/mL (ref <5)

## 2022-09-22 LAB — TROPONIN I (HIGH SENSITIVITY): Troponin I (High Sensitivity): <2 ng/L (ref <18)

## 2022-09-22 LAB — D-DIMER, QUANTITATIVE: D-Dimer, Quant: 0.42 ug/mL-FEU (ref 0.00–0.50)

## 2022-09-22 MED ORDER — IPRATROPIUM-ALBUTEROL 0.5-2.5 (3) MG/3ML IN SOLN
3.0000 mL | Freq: Once | RESPIRATORY_TRACT | Status: AC
  Administered 2022-09-22: 3 mL via RESPIRATORY_TRACT
  Filled 2022-09-22: qty 3

## 2022-09-22 MED ORDER — PREDNISONE 10 MG PO TABS: 40.0000 mg | ORAL_TABLET | Freq: Every day | ORAL | 0 refills | Status: AC

## 2022-09-22 MED ORDER — BUDESONIDE-FORMOTEROL FUMARATE 80-4.5 MCG/ACT IN AERO: 2.0000 | INHALATION_SPRAY | Freq: Two times a day (BID) | RESPIRATORY_TRACT | 12 refills | Status: AC

## 2022-09-22 MED ORDER — KETOROLAC TROMETHAMINE 15 MG/ML IJ SOLN
15.0000 mg | Freq: Once | INTRAMUSCULAR | Status: AC
  Administered 2022-09-22: 15 mg via INTRAVENOUS
  Filled 2022-09-22: qty 1

## 2022-09-22 MED ORDER — IPRATROPIUM-ALBUTEROL 0.5-2.5 (3) MG/3ML IN SOLN
6.0000 mL | Freq: Once | RESPIRATORY_TRACT | Status: AC
  Administered 2022-09-22: 6 mL via RESPIRATORY_TRACT
  Filled 2022-09-22: qty 6

## 2022-09-22 MED ORDER — METHYLPREDNISOLONE SODIUM SUCC 125 MG IJ SOLR
125.0000 mg | Freq: Once | INTRAMUSCULAR | Status: AC
  Administered 2022-09-22: 125 mg via INTRAVENOUS
  Filled 2022-09-22: qty 2

## 2022-09-22 MED ORDER — LORAZEPAM 0.5 MG PO TABS
0.5000 mg | ORAL_TABLET | Freq: Once | ORAL | Status: AC
  Administered 2022-09-22: 0.5 mg via ORAL
  Filled 2022-09-22: qty 1

## 2022-09-22 NOTE — ED Triage Notes (Signed)
Pt to er, pt states that it was a very busy day yesterday in the clinic, states that around lunch time she stared having some chest pain, states that it is worse when she takes a deep breath and moves, states that he bp was a little elevated and was sent to the ed.  Pt states that she smokes a little bit and is on the [place]ina.

## 2022-09-22 NOTE — ED Provider Notes (Signed)
[place] EMERGENCY DEPARTMENT AT [facility] Provider Note  CSN: [id number] Arrival date & time: 09/22/22 0840  Chief Complaint(s) Chest Pain  HPI Alexandria Gutierrez is a 42 y.o. female with PMH anxiety, depression who presents emergency room for evaluation of chest pain and shortness of breath.  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  She describes chest pain as sharp, worse with deep breaths over the left chest wall.  Denies radiation, associated nausea, vomiting, diaphoresis or any exertional component to the pain.  However, patient states that she does feel a little exertional shortness of breath.  She states that a physician evaluated her in the clinic today and "did not like the way the lungs sounded on the left" and sent the patient to the emergency department for further evaluation.  Patient's blood pressure was reportedly elevated in the office but is normal here in the ER today.   Past Medical History Past Medical History:  Diagnosis Date   Abnormal finding on Pap smear 01/2011   HSIL   Anxiety    Chicken pox    Depression    History of blood transfusion 01/16/2016   HSIL (high grade squamous intraepithelial lesion) on Pap smear 04/12/2011   Postpartum care following vaginal delivery (08/04/13) 08/04/2013   SVD (spontaneous vaginal delivery) 08/04/2013   Patient Active Problem List   Diagnosis Date Noted   Long-term current use of benzodiazepine 07/10/2022   Cocaine use disorder, mild, in sustained remission (HCC) 07/10/2022   Methamphetamine use disorder, moderate, in sustained remission (HCC) 07/10/2022   Hallucinogenic mushrooms use disorder, mild, in sustained remission (HCC) 07/10/2022   Cannabis use disorder 07/10/2022   Vitamin D deficiency 07/10/2022   Right elbow pain 07/13/2018   Benign hypermobility syndrome 07/13/2018   B12 deficiency 12/30/2017   Generalized anxiety disorder with panic attacks  12/26/2017   Insomnia 07/29/2017   Major depressive disorder, recurrent severe without psychotic features (HCC) 07/29/2017   Antepartum placental abruption 01/16/2016   Rh negative state in antepartum period 01/16/2016   Status post primary low transverse cesarean section--complete abruption 01/16/2016   Smoker 01/16/2016   Abnormal Pap smear of cervix 08/22/2015   Mantoux: positive 08/18/2015   CIN II (cervical intraepithelial neoplasia II) 04/12/2011   Home Medication(s) Prior to Admission medications   Medication Sig Start Date End Date Taking? Authorizing Provider  levonorgestrel (MIRENA) 20 MCG/24HR IUD 1 each once by Intrauterine route.    [provider]  mirtazapine (REMERON) 15 MG tablet Take 1 tablet (15 mg total) by mouth at bedtime. 08/10/22   Elsie Lincoln, MD  sertraline (ZOLOFT) 100 MG tablet Take 1 tablet (100 mg total) by mouth daily. 08/10/22   Elsie Lincoln, MD  Past Surgical History Past Surgical History:  Procedure Laterality Date   CESAREAN SECTION N/A 01/16/2016   Procedure: CESAREAN SECTION;  Surgeon: Myna Hidalgo, DO;  Location: [facility];  Service: Obstetrics;  Laterality: N/A;   CYSTECTOMY Right 66 or 42 years old   on wrist   MASS EXCISION N/A 12/11/2021   Procedure: EXCISION MASS of abdominal wall;  Surgeon: Luretha Murphy, MD;  Location: [facility];  Service: General;  Laterality: N/A;   WISDOM TOOTH EXTRACTION     Family History Family History  Problem Relation Age of Onset   Alcohol abuse Maternal Aunt    Mental illness Maternal Aunt    Endometriosis Mother    Heart disease Maternal Grandmother    Heart disease Maternal Grandfather    Stroke Paternal Grandfather    Colon cancer Neg Hx    Esophageal cancer Neg Hx    Rectal cancer Neg Hx     Social History Social History    Tobacco Use   Smoking status: Every Day    Packs/day: 0.25    Years: 22.00    Additional pack years: 0.00    Total pack years: 5.50    Types: Cigarettes   Smokeless tobacco: Never   Tobacco comments:    5 cigarettes/day as of 07/10/2022  Vaping Use   Vaping Use: Never used  Substance Use Topics   Alcohol use: Yes    Comment: rare   Drug use: Yes    Types: Marijuana   Allergies Erythromycin and Horse-derived products  Review of Systems Review of Systems  Respiratory:  Positive for shortness of breath.   Cardiovascular:  Positive for chest pain.    Physical Exam Vital Signs  I have reviewed the triage vital signs BP 128/81 (BP Location: Left Arm)   Pulse 77   Temp 98.4 F (36.9 C) (Oral)   Resp 16   Ht 5\' 3"  (1.6 m)   Wt 65.3 kg   SpO2 99%   BMI 25.51 kg/m   Physical Exam Vitals and nursing note reviewed.  Constitutional:      General: She is not in acute distress.    Appearance: She is well-developed.  HENT:     Head: Normocephalic and atraumatic.  Eyes:     Conjunctiva/sclera: Conjunctivae normal.  Cardiovascular:     Rate and Rhythm: Normal rate and regular rhythm.     Heart sounds: No murmur heard. Pulmonary:     Effort: Pulmonary effort is normal. No respiratory distress.     Breath sounds: Wheezing present.  Musculoskeletal:        General: No swelling.     Cervical back: Neck supple.  Skin:    General: Skin is warm and dry.     Capillary Refill: Capillary refill takes less than 2 seconds.  Neurological:     Mental Status: She is alert.  Psychiatric:        Mood and Affect: Mood normal.     ED Results and Treatments Labs (all labs ordered are listed, but only abnormal results are displayed) Labs Reviewed  COMPREHENSIVE METABOLIC PANEL  CBC WITH DIFFERENTIAL/PLATELET  D-DIMER, QUANTITATIVE  I-STAT BETA HCG BLOOD, ED ([facility], [facility], [facility] ONLY)  TROPONIN I (HIGH SENSITIVITY)  Radiology DG Chest 2 View  Result Date: 09/22/2022 CLINICAL DATA:  Chest pain EXAM: CHEST - 2 VIEW COMPARISON:  02/06/2014 FINDINGS: The heart size and mediastinal contours are within normal limits. Both lungs are clear. The visualized skeletal structures are unremarkable. IMPRESSION: No active cardiopulmonary disease. Electronically Signed   By: Paulina Fusi M.D.   On: 09/22/2022 09:14    Pertinent labs & imaging results that were available during my care of the patient were reviewed by me and considered in my medical decision making (see MDM for details).  Medications Ordered in ED Medications  ipratropium-albuterol (DUONEB) 0.5-2.5 (3) MG/3ML nebulizer solution 3 mL (has no administration in time range)                                                                                                                                     Procedures .Critical Care  Performed by: Glendora Score, MD Authorized by: Glendora Score, MD   Critical care provider statement:    Critical care time (minutes):  30   Critical care was necessary to treat or prevent imminent or life-threatening deterioration of the following conditions:  Respiratory failure   Critical care was time spent personally by me on the following activities:  Development of treatment plan with patient or surrogate, discussions with consultants, evaluation of patient's response to treatment, examination of patient, ordering and review of laboratory studies, ordering and review of radiographic studies, ordering and performing treatments and interventions, pulse oximetry, re-evaluation of patient's condition and review of old charts   (including critical care time)  Medical Decision Making / ED Course   This patient presents to the ED for concern of chest pain, dyspnea, this involves an extensive number of treatment options, and is a complaint that carries with it a high risk of  complications and morbidity.  The differential diagnosis includes pneumonia, COPD/asthma, PE, pleurisy, costochondritis, musculoskeletal strain  MDM: Patient seen emergency room for evaluation of chest pain shortness of breath.  Physical exam with wheezing bilaterally which is apparently new for this patient.  Laboratory evaluation is reassuring with negative high-sensitivity troponin and negative D-dimer.  Chest x-ray unremarkable.  Patient received a single DuoNeb and on reevaluation, wheezing more prominent suggestive of likely very tight airways and associated muscular strain associated with increased work of breathing.  Patient received 2 additional DuoNeb's, methylprednisolone, Toradol and a small amount of Ativan for the jitteriness and anxiety secondary to albuterol use.  On reevaluation, wheezing has complete resolved and patient is feeling significantly better.  Patient will be discharged with a Symbicort inhaler to be used as daily maintenance and for rescue as needed and patient will follow-up outpatient with pulmonology.  I placed an outpatient pulmonology referral and an additional steroid prescription was sent to pharmacy..  At this time with symptoms resolved patient does not meet inpatient criteria for admission she is safe for discharge with outpatient follow-up.  Heart  score less than 4.   Additional history obtained:  -External records from outside source obtained and reviewed including: Chart review including previous notes, labs, imaging, consultation notes   Lab Tests: -I ordered, reviewed, and interpreted labs.   The pertinent results include:   Labs Reviewed  COMPREHENSIVE METABOLIC PANEL  CBC WITH DIFFERENTIAL/PLATELET  D-DIMER, QUANTITATIVE  I-STAT BETA HCG BLOOD, ED ([facility], [facility], [facility] ONLY)  TROPONIN I (HIGH SENSITIVITY)      EKG   EKG Interpretation  Date/Time:  Wednesday Sep 22 2022 08:48:15 EDT Ventricular Rate:  81 PR Interval:  123 QRS Duration: 85 QT  Interval:  364 QTC Calculation: 423 R Axis:   82 Text Interpretation: Sinus rhythm Right atrial enlargement Confirmed by Elias Dennington ([id number]) on 09/22/2022 8:55:10 AM         Imaging Studies ordered: I ordered imaging studies including CXR I independently visualized and interpreted imaging. I agree with the radiologist interpretation   Medicines ordered and prescription drug management: Meds ordered this encounter  Medications   ipratropium-albuterol (DUONEB) 0.5-2.5 (3) MG/3ML nebulizer solution 3 mL    -I have reviewed the patients home medicines and have made adjustments as needed  Critical interventions Multiple DuoNebs, methylprednisolone   Cardiac Monitoring: The patient was maintained on a cardiac monitor.  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, current smoking, discussion had around cessation and resources provided   Reevaluation: After the interventions noted above, I reevaluated the patient and found that they have :improved  Co morbidities that complicate the patient evaluation  Past Medical History:  Diagnosis Date   Abnormal finding on Pap smear 01/2011   HSIL   Anxiety    Chicken pox    Depression    History of blood transfusion 01/16/2016   HSIL (high grade squamous intraepithelial lesion) on Pap smear 04/12/2011   Postpartum care following vaginal delivery (08/04/13) 08/04/2013   SVD (spontaneous vaginal delivery) 08/04/2013      Dispostion: I considered admission for this patient, but at this time, with symptoms improved, patient safer discharge with outpatient follow-up.     Final Clinical Impression(s) / ED Diagnoses Final diagnoses:  None     @PCDICTATION @    Glendora Score, MD 09/22/22 1932

## 2022-09-22 NOTE — ED Notes (Signed)
Patient transported to X-ray 

## 2022-09-23 ENCOUNTER — Other Ambulatory Visit: Payer: Self-pay

## 2022-09-23 ENCOUNTER — Encounter (HOSPITAL_COMMUNITY): Payer: Self-pay

## 2022-09-23 ENCOUNTER — Emergency Department (HOSPITAL_COMMUNITY)
Admission: EM | Admit: 2022-09-23 | Discharge: 2022-09-23 | Discharge status: Against Medical Advice | Payer: 59 | Attending: Emergency Medicine | Admitting: Emergency Medicine

## 2022-09-23 DIAGNOSIS — Z5321 Procedure and treatment not carried out due to patient leaving prior to being seen by health care provider: Secondary | ICD-10-CM | POA: Insufficient documentation

## 2022-09-23 DIAGNOSIS — R0789 Other chest pain: Secondary | ICD-10-CM | POA: Insufficient documentation

## 2022-09-23 DIAGNOSIS — R0602 Shortness of breath: Secondary | ICD-10-CM | POA: Insufficient documentation

## 2022-09-23 NOTE — ED Notes (Signed)
Patient no longer in waiting room, has been called for x3 with no answer.

## 2022-09-23 NOTE — ED Triage Notes (Signed)
C/o left sided cp with exertional sob x2 days.  Denies radiation.  Sob is worse when laying flat.  Seen yesterday for same with outpatient pulmonology referral and steroids and inhaler w/o relief.

## 2022-09-28 ENCOUNTER — Other Ambulatory Visit: Payer: Self-pay | Admitting: *Deleted

## 2022-10-15 ENCOUNTER — Ambulatory Visit (INDEPENDENT_AMBULATORY_CARE_PROVIDER_SITE_OTHER): Payer: 59 | Admitting: Pulmonary Disease

## 2022-10-15 ENCOUNTER — Encounter: Payer: Self-pay | Admitting: Pulmonary Disease

## 2022-10-15 VITALS — BP 110/70 | HR 69 | Temp 98.1°F | Ht 63.0 in | Wt 153.8 lb

## 2022-10-15 DIAGNOSIS — J4520 Mild intermittent asthma, uncomplicated: Secondary | ICD-10-CM | POA: Diagnosis not present

## 2022-10-15 NOTE — Progress Notes (Signed)
Synopsis: Referred in May 2024 for shortness of breath/chest pain by Glendora Score, MD  Subjective:   PATIENT ID: Alexandria Gutierrez GENDER: female DOB: 1981/05/12, MRN: [id number]  HPI  Chief Complaint  Patient presents with   Consult    CXR 09/22/2022 ED 09/22/22   Alexandria Gutierrez is a 42 year old woman, daily smoker who is referred to pulmonary clinic for chest pain and shortness of breath.   She was seen in the ER on 09/22/22 for shortness of breath and chest pains of 1 day. D-dimer was not elevated. Troponin not elevated. EKG was NSR with no ischemic changes. Chest x-ray was unremarkable. Wheezing was noted on exam. Her symptoms improved with  multiple duoneb treatments, methylprednisolone and toradol. She was discharged with 40mg  of prednisone for 4 days and a symbicort 80-4.18mcg 2 puffs twice daily inhaler.   She is feeling better since that visit. She has noticed increased shortness of breath over recent months with exertion. She denies history of seasonal allergies. She had one other episode of wheezing/dyspnea after she first moved to [facility] which required an albuterol inhaler.   She has gained 30lbs in 3 months. She started remeron for depression and sleep issues. Her depression symptoms and sleep are improved as well as her appetite.   She does have post-nasal drip during allergy season. Denies heart burn or reflux.   She started smoking at age 44. She has been smoking 5-7 cigarettes per day. She was smoking 2 packs per day for 1 year but otherwise quarter to half a pack per day smoker. She had significant second hand smoke from her grandparents. No lung disease in the family.  She is a CMA at [employer].   She quit smoking last weekend. She has patches to use as needed.    Past Medical History:  Diagnosis Date   Abnormal finding on Pap smear 01/2011   HSIL   Anxiety    Chicken pox    Depression    History of blood transfusion 01/16/2016   HSIL (high grade squamous  intraepithelial lesion) on Pap smear 04/12/2011   Postpartum care following vaginal delivery (08/04/13) 08/04/2013   SVD (spontaneous vaginal delivery) 08/04/2013     Family History  Problem Relation Age of Onset   Alcohol abuse Maternal Aunt    Mental illness Maternal Aunt    Endometriosis Mother    Heart disease Maternal Grandmother    Heart disease Maternal Grandfather    Stroke Paternal Grandfather    Colon cancer Neg Hx    Esophageal cancer Neg Hx    Rectal cancer Neg Hx      Social History   Socioeconomic History   Marital status: Married    Spouse name: Not on file   Number of children: 3   Years of education: Not on file   Highest education level: Not on file  Occupational History   Occupation: CMA  Tobacco Use   Smoking status: Former    Packs/day: 0.25    Years: 22.00    Additional pack years: 0.00    Total pack years: 5.50    Types: Cigarettes   Smokeless tobacco: Never   Tobacco comments:    Last cigarette since Sunday 10/10/2022.BT CMA   Vaping Use   Vaping Use: Never used  Substance and Sexual Activity   Alcohol use: Yes    Comment: rare   Drug use: Yes    Types: Marijuana   Sexual activity: Yes  Partners: Male    Birth control/protection: I.U.D.    Comment: pregnant  Other Topics Concern   Not on file  Social History Narrative   Not on file   Social Determinants of Health   Financial Resource Strain: Not on file  Food Insecurity: Not on file  Transportation Needs: Not on file  Physical Activity: Not on file  Stress: Not on file  Social Connections: Not on file  Intimate Partner Violence: Not on file     Allergies  Allergen Reactions   Erythromycin Nausea And Vomiting and Other (See Comments)    Spikes high fever to 103   Horse Epithelium Hives   Horse-Derived Products Swelling    Horses     Outpatient Medications Prior to Visit  Medication Sig Dispense Refill   budesonide-formoterol (SYMBICORT) 80-4.5 MCG/ACT inhaler Inhale 2  puffs into the lungs in the morning and at bedtime. 1 each 12   clonazePAM (KLONOPIN) 0.5 MG tablet      mirtazapine (REMERON) 15 MG tablet Take 1 tablet (15 mg total) by mouth at bedtime. 30 tablet 1   sertraline (ZOLOFT) 100 MG tablet Take 1 tablet (100 mg total) by mouth daily. 30 tablet 1   levonorgestrel (MIRENA) 20 MCG/24HR IUD 1 each once by Intrauterine route. (Patient not taking: Reported on 10/15/2022)     No facility-administered medications prior to visit.    Review of Systems  Constitutional:  Negative for chills, fever, malaise/fatigue and weight loss.  HENT:  Negative for congestion, sinus pain and sore throat.   Eyes: Negative.   Respiratory:  Positive for shortness of breath. Negative for cough, hemoptysis, sputum production and wheezing.   Cardiovascular:  Negative for chest pain, palpitations, orthopnea, claudication and leg swelling.  Gastrointestinal:  Negative for abdominal pain, heartburn, nausea and vomiting.  Genitourinary: Negative.   Musculoskeletal:  Negative for joint pain and myalgias.  Skin:  Negative for rash.  Neurological:  Negative for weakness.  Endo/Heme/Allergies: Negative.   Psychiatric/Behavioral: Negative.        Objective:   Vitals:   10/15/22 0836  BP: 110/70  Pulse: 69  Temp: 98.1 F (36.7 C)  TempSrc: Oral  SpO2: 95%  Weight: 153 lb 12.8 oz (69.8 kg)  Height: 5\' 3"  (1.6 m)     Physical Exam Constitutional:      General: She is not in acute distress.    Appearance: She is not ill-appearing.  HENT:     Head: Normocephalic and atraumatic.  Cardiovascular:     Rate and Rhythm: Normal rate and regular rhythm.     Pulses: Normal pulses.     Heart sounds: Normal heart sounds. No murmur heard. Pulmonary:     Effort: Pulmonary effort is normal.     Breath sounds: Normal breath sounds. No wheezing, rhonchi or rales.  Musculoskeletal:     Right lower leg: No edema.     Left lower leg: No edema.  Skin:    General: Skin is warm  and dry.  Neurological:     General: No focal deficit present.     Mental Status: She is alert.    CBC    Component Value Date/Time   WBC 8.9 09/22/2022 0915   RBC 4.57 09/22/2022 0915   HGB 14.3 09/22/2022 0915   HCT 42.5 09/22/2022 0915   PLT 229 09/22/2022 0915   MCV 93.0 09/22/2022 0915   MCH 31.3 09/22/2022 0915   MCHC 33.6 09/22/2022 0915   RDW 12.7 09/22/2022 0915  LYMPHSABS 2.3 09/22/2022 0915   MONOABS 0.9 09/22/2022 0915   EOSABS 0.4 09/22/2022 0915   BASOSABS 0.1 09/22/2022 0915      Latest Ref Rng & Units 09/22/2022    9:15 AM 07/16/2021   11:05 AM 07/29/2017    9:11 AM  BMP  Glucose 70 - 99 mg/dL 98  93  76   BUN 6 - 20 mg/dL 8  7  10    Creatinine 0.44 - 1.00 mg/dL 4.09  8.11  9.14   Sodium 135 - 145 mmol/L 139  140  139   Potassium 3.5 - 5.1 mmol/L 3.8  3.9  4.4   Chloride 98 - 111 mmol/L 105  105  103   CO2 22 - 32 mmol/L 26  27  29    Calcium 8.9 - 10.3 mg/dL 9.3  9.7  78.2     Chest imaging: CXR 09/22/22 The heart size and mediastinal contours are within normal limits. Both lungs are clear. The visualized skeletal structures are Unremarkable  PFT:     No data to display          Labs:  Path:  Echo:  Heart Catheterization:  Assessment & Plan:   Mild intermittent asthma without complication  Discussion: Alexandria Gutierrez is a 42 year old woman, daily smoker who is referred to pulmonary clinic for chest pain and shortness of breath.   She appears to have mild intermittent asthma symptoms with wheezing and dyspnea. She is to continue symbicort 80-4.52mcg 2 puffs twice daily and as needed albuterol. She has absolute eosinophil count of 400 on 5/1.   We will have her return in 3 months for pulmonary function tests.   I have commended her for wanting to quit smoking. We discussed nicotine replacement therapy for 3 minutes. Recommend use of 7mg  nicotine patches per day and using mini nicotine lozenges as needed.  Informed her she may notice  further weight gain with smoking cessation and worsening of a cough for 6-9 months.  Follow up in 3 months.  Melody Comas, MD [place] Office: [phone]      Current Outpatient Medications:    budesonide-formoterol (SYMBICORT) 80-4.5 MCG/ACT inhaler, Inhale 2 puffs into the lungs in the morning and at bedtime., Disp: 1 each, Rfl: 12   clonazePAM (KLONOPIN) 0.5 MG tablet, , Disp: , Rfl:    mirtazapine (REMERON) 15 MG tablet, Take 1 tablet (15 mg total) by mouth at bedtime., Disp: 30 tablet, Rfl: 1   sertraline (ZOLOFT) 100 MG tablet, Take 1 tablet (100 mg total) by mouth daily., Disp: 30 tablet, Rfl: 1   levonorgestrel (MIRENA) 20 MCG/24HR IUD, 1 each once by Intrauterine route. (Patient not taking: Reported on 10/15/2022), Disp: , Rfl:

## 2022-10-15 NOTE — Patient Instructions (Addendum)
Continue symbicort 80-4.69mcg 2 puffs twice daily - rinse mouth out after each use  Use albuterol inhaler 1-2 puffs every 4-6 hours as needed  Follow up in 3 months with pulmonary function tests

## 2022-10-20 ENCOUNTER — Encounter: Payer: Self-pay | Admitting: Pulmonary Disease

## 2022-10-21 ENCOUNTER — Other Ambulatory Visit: Payer: Self-pay

## 2022-10-21 MED ORDER — ALBUTEROL SULFATE HFA 108 (90 BASE) MCG/ACT IN AERS: 2.0000 | INHALATION_SPRAY | Freq: Four times a day (QID) | RESPIRATORY_TRACT | 3 refills | Status: AC | PRN

## 2022-10-27 ENCOUNTER — Ambulatory Visit (INDEPENDENT_AMBULATORY_CARE_PROVIDER_SITE_OTHER): Payer: 59 | Admitting: Nurse Practitioner

## 2022-10-27 ENCOUNTER — Encounter: Payer: Self-pay | Admitting: Nurse Practitioner

## 2022-10-27 VITALS — BP 120/80 | Temp 97.4°F | Ht 63.0 in | Wt 155.0 lb

## 2022-10-27 DIAGNOSIS — E559 Vitamin D deficiency, unspecified: Secondary | ICD-10-CM

## 2022-10-27 DIAGNOSIS — F410 Panic disorder [episodic paroxysmal anxiety] without agoraphobia: Secondary | ICD-10-CM

## 2022-10-27 DIAGNOSIS — F17200 Nicotine dependence, unspecified, uncomplicated: Secondary | ICD-10-CM

## 2022-10-27 DIAGNOSIS — E538 Deficiency of other specified B group vitamins: Secondary | ICD-10-CM | POA: Diagnosis not present

## 2022-10-27 DIAGNOSIS — Z1329 Encounter for screening for other suspected endocrine disorder: Secondary | ICD-10-CM | POA: Diagnosis not present

## 2022-10-27 DIAGNOSIS — J4520 Mild intermittent asthma, uncomplicated: Secondary | ICD-10-CM | POA: Insufficient documentation

## 2022-10-27 DIAGNOSIS — F332 Major depressive disorder, recurrent severe without psychotic features: Secondary | ICD-10-CM

## 2022-10-27 DIAGNOSIS — F1411 Cocaine abuse, in remission: Secondary | ICD-10-CM

## 2022-10-27 DIAGNOSIS — R635 Abnormal weight gain: Secondary | ICD-10-CM

## 2022-10-27 DIAGNOSIS — F411 Generalized anxiety disorder: Secondary | ICD-10-CM

## 2022-10-27 DIAGNOSIS — F1290 Cannabis use, unspecified, uncomplicated: Secondary | ICD-10-CM

## 2022-10-27 NOTE — Assessment & Plan Note (Signed)
Flowsheet Row Office Visit from 10/27/2022 in [facility]  PHQ-9 Total Score 5     Well-controlled on sertraline 100 mg daily, Remeron 15 mg daily Continue current medication and maintain close follow-up with psychiatrist

## 2022-10-27 NOTE — Assessment & Plan Note (Signed)
Wt Readings from Last 3 Encounters:  10/27/22 155 lb (70.3 kg)  10/15/22 153 lb 12.8 oz (69.8 kg)  09/22/22 144 lb (65.3 kg)   Body mass index is 27.46 kg/m.  Patient referred to the nutritionist She was counseled on low-carb modified diet Encouraged to engage in regular moderate to vigorous exercise of at least 150 minutes weekly

## 2022-10-27 NOTE — Assessment & Plan Note (Signed)
Continue albuterol inhaler 2 puffs every 6 hours as needed, Symbicort 2 puffs twice daily Maintain close follow-up with pulmonology Smoking cessation encouraged

## 2022-10-27 NOTE — Assessment & Plan Note (Signed)
Need to avoid smoking marijuana discussed with the patient 

## 2022-10-27 NOTE — Assessment & Plan Note (Signed)
Currently not on vitamin B12 supplement Check labs

## 2022-10-27 NOTE — Progress Notes (Signed)
New Patient Office Visit  Subjective:  Patient ID: Alexandria Gutierrez, female    DOB: 06/16/1980  Age: 42 y.o. MRN: [id number]  CC:  Chief Complaint  Patient presents with   Establish Care    HPI Alexandria Gutierrez is a 42 y.o. female  has a past medical history of Abnormal finding on Pap smear (01/2011), Anxiety, Asthma, Cannabis use disorder, Chicken pox, Cigarette smoker, Cocaine use disorder in remission, Depression, History of blood transfusion (01/16/2016), HSIL (high grade squamous intraepithelial lesion) on Pap smear (04/12/2011), Postpartum care following vaginal delivery (08/04/13) (08/04/2013), SVD (spontaneous vaginal delivery) (08/04/2013), Vitamin B 12 deficiency, and Vitamin D deficiency.  Patient presents to establish care for her chronic medical conditions.  Previous PCP Dr Joselyn Glassman at [facility].  Last visit was over a year ago.  Anxiety and depression she is being followed by psychiatrist.  Taking Remeron 50 mg daily at bedtime, Zoloft 100 mg daily.  Rarely takes clonazepam.  She reports weight gain of about 40 pounds since starting Remeron.  States that her diet can be better.  She would like referral to a nutritionist.   Mild intermittent asthma.  Followed by the pulmonologist .  Currently on albuterol inhaler and Symbicort.  Has intermittent wheezing shortness of breath.  She thinks that her weight gain might be contributing to this. She started smoking cigarettes at age 71 she smokes a quarter of a pack up to May 2024.  Now smoking less.  She is working on cutting back with the aim of quitting.  Last Pap smear was in November 2021, had a mammogram at work in November 2023.  Tests were normal.  Thinks that she has had a Tdap vaccine within the past 10 years.  Records requested today.  Has history of drug abuse, stated that she has used different drugs in the past  but has been been clean since 2019.  Now smokes marijuana daily    Past Medical History:   Diagnosis Date   Abnormal finding on Pap smear 01/2011   HSIL   Anxiety    Asthma    Cannabis use disorder    Chicken pox    Cigarette smoker    Cocaine use disorder in remission    Depression    History of blood transfusion 01/16/2016   HSIL (high grade squamous intraepithelial lesion) on Pap smear 04/12/2011   Postpartum care following vaginal delivery (08/04/13) 08/04/2013   SVD (spontaneous vaginal delivery) 08/04/2013   Vitamin B 12 deficiency    Vitamin D deficiency     Past Surgical History:  Procedure Laterality Date   CESAREAN SECTION N/A 01/16/2016   Procedure: CESAREAN SECTION;  Surgeon: Myna Hidalgo, DO;  Location: [facility];  Service: Obstetrics;  Laterality: N/A;   CYSTECTOMY Right 66 or 42 years old   on wrist   EXCISION OF ENDOMETRIOMA  2023   MASS EXCISION N/A 12/11/2021   Procedure: EXCISION MASS of abdominal wall;  Surgeon: Luretha Murphy, MD;  Location: [facility];  Service: General;  Laterality: N/A;   WISDOM TOOTH EXTRACTION      Family History  Problem Relation Age of Onset   Endometriosis Mother    Hypertension Mother    Stroke Mother    Alcohol abuse Maternal Aunt    Mental illness Maternal Aunt    Heart disease Maternal Grandmother    Heart disease Maternal Grandfather    Stroke Paternal Grandfather    Colon cancer Neg Hx  Esophageal cancer Neg Hx    Rectal cancer Neg Hx     Social History   Socioeconomic History   Marital status: Married    Spouse name: Not on file   Number of children: 3   Years of education: Not on file   Highest education level: Not on file  Occupational History   Occupation: CMA  Tobacco Use   Smoking status: Some Days    Packs/day: 0.25    Years: 22.00    Additional pack years: 0.00    Total pack years: 5.50    Types: Cigarettes   Smokeless tobacco: Never   Tobacco comments:    Last cigarette since Sunday 10/10/2022.BT CMA   Vaping Use   Vaping Use: Never used  Substance and  Sexual Activity   Alcohol use: Not Currently    Comment: rare   Drug use: Yes    Types: Marijuana   Sexual activity: Yes    Partners: Male    Birth control/protection: I.U.D.  Other Topics Concern   Not on file  Social History Narrative   Lives with her husband.    Social Determinants of Health   Financial Resource Strain: Not on file  Food Insecurity: Not on file  Transportation Needs: Not on file  Physical Activity: Not on file  Stress: Not on file  Social Connections: Not on file  Intimate Partner Violence: Not on file    ROS Review of Systems  Constitutional:  Negative for activity change, appetite change, chills, fatigue and fever.       Weight gain  HENT:  Negative for congestion, dental problem, ear discharge, ear pain, hearing loss, rhinorrhea, sinus pressure, sinus pain, sneezing and sore throat.   Eyes:  Negative for pain, discharge, redness and itching.  Respiratory:  Positive for shortness of breath and wheezing. Negative for cough and chest tightness.        Stable on albuterol and Symbicort  Cardiovascular:  Negative for chest pain, palpitations and leg swelling.  Gastrointestinal:  Negative for abdominal distention, abdominal pain, anal bleeding, blood in stool, constipation and diarrhea.  Endocrine: Negative for cold intolerance, heat intolerance, polydipsia, polyphagia and polyuria.  Genitourinary:  Negative for difficulty urinating, dysuria, flank pain, frequency, hematuria, menstrual problem, pelvic pain and vaginal bleeding.  Musculoskeletal:  Negative for arthralgias, back pain, gait problem, joint swelling and myalgias.  Skin:  Negative for color change, pallor, rash and wound.  Allergic/Immunologic: Negative for environmental allergies, food allergies and immunocompromised state.  Neurological:  Negative for dizziness, tremors, facial asymmetry, weakness and headaches.  Hematological:  Negative for adenopathy. Does not bruise/bleed easily.   Psychiatric/Behavioral:  Negative for agitation, behavioral problems, confusion, decreased concentration, hallucinations, self-injury and suicidal ideas.     Objective:   Today's Vitals: BP 120/80   Temp (!) 97.4 F (36.3 C)   Ht 5\' 3"  (1.6 m)   Wt 155 lb (70.3 kg)   BMI 27.46 kg/m   Physical Exam Vitals and nursing note reviewed.  Constitutional:      General: She is not in acute distress.    Appearance: Normal appearance. She is not ill-appearing, toxic-appearing or diaphoretic.  HENT:     Mouth/Throat:     Mouth: Mucous membranes are moist.     Pharynx: Oropharynx is clear. No oropharyngeal exudate or posterior oropharyngeal erythema.  Eyes:     General: No scleral icterus.       Right eye: No discharge.        Left eye:  No discharge.     Extraocular Movements: Extraocular movements intact.     Conjunctiva/sclera: Conjunctivae normal.  Cardiovascular:     Rate and Rhythm: Normal rate and regular rhythm.     Pulses: Normal pulses.     Heart sounds: Normal heart sounds. No murmur heard.    No friction rub. No gallop.  Pulmonary:     Effort: Pulmonary effort is normal. No respiratory distress.     Breath sounds: Normal breath sounds. No stridor. No wheezing, rhonchi or rales.  Chest:     Chest wall: No tenderness.  Abdominal:     General: There is no distension.     Palpations: Abdomen is soft.     Tenderness: There is no abdominal tenderness. There is no right CVA tenderness, left CVA tenderness or guarding.  Musculoskeletal:        General: No swelling, tenderness, deformity or signs of injury.     Right lower leg: No edema.     Left lower leg: No edema.  Skin:    General: Skin is warm and dry.     Capillary Refill: Capillary refill takes less than 2 seconds.     Coloration: Skin is not jaundiced or pale.     Findings: No bruising, erythema or lesion.  Neurological:     Mental Status: She is alert and oriented to person, place, and time.     Motor: No  weakness.     Coordination: Coordination normal.     Gait: Gait normal.  Psychiatric:        Mood and Affect: Mood normal.        Behavior: Behavior normal.        Thought Content: Thought content normal.        Judgment: Judgment normal.     Assessment & Plan:   Problem List Items Addressed This Visit       Respiratory   Mild intermittent asthma without complication - Primary    Continue albuterol inhaler 2 puffs every 6 hours as needed, Symbicort 2 puffs twice daily Maintain close follow-up with pulmonology Smoking cessation encouraged        Other   Major depressive disorder, recurrent severe without psychotic features (HCC) (Chronic)    Flowsheet Row Office Visit from 10/27/2022 in [facility]  PHQ-9 Total Score 5     Well-controlled on sertraline 100 mg daily, Remeron 15 mg daily Continue current medication and maintain close follow-up with psychiatrist      Generalized anxiety disorder with panic attacks (Chronic)    Continue sertraline 100 mg daily, clonazepam as needed Maintain close follow-up with psychiatry She denies SI, HI      Cannabis use disorder (Chronic)    Need to avoid smoking marijuana discussed with the patient      Vitamin D deficiency (Chronic)    Checking vitamin D levels      Relevant Orders   VITAMIN D 25 Hydroxy (Vit-D Deficiency, Fractures)   Smoker    Smokes about  less than a quarter pack/day  Asked about quitting: confirms that he/she currently smokes cigarettes Advise to quit smoking: Educated about QUITTING to reduce the risk of cancer, cardio and cerebrovascular disease. Assess willingness: Unwilling to quit at this time, but is working on cutting back. Assist with counseling and pharmacotherapy: Counseled for 5 minutes and literature provided. Arrange for follow up: follow up in 6 months and continue to offer help.       B12 deficiency  Currently not on vitamin B12 supplement Check labs       Relevant Orders   Vitamin B12   Cocaine use disorder, mild, in sustained remission (HCC)    She continues to be in remission Patient encouraged to continue to avoid use of illicit drugs      Weight gain    Wt Readings from Last 3 Encounters:  10/27/22 155 lb (70.3 kg)  10/15/22 153 lb 12.8 oz (69.8 kg)  09/22/22 144 lb (65.3 kg)   Body mass index is 27.46 kg/m.  Patient referred to the nutritionist She was counseled on low-carb modified diet Encouraged to engage in regular moderate to vigorous exercise of at least 150 minutes weekly      Relevant Orders   Amb ref to Medical Nutrition Therapy-MNT   Other Visit Diagnoses     Screening for thyroid disorder       Relevant Orders   TSH       Outpatient Encounter Medications as of 10/27/2022  Medication Sig   albuterol (VENTOLIN HFA) 108 (90 Base) MCG/ACT inhaler Inhale 2 puffs into the lungs every 6 (six) hours as needed for wheezing or shortness of breath. Every 4-6hrs prn   budesonide-formoterol (SYMBICORT) 80-4.5 MCG/ACT inhaler Inhale 2 puffs into the lungs in the morning and at bedtime.   clonazePAM (KLONOPIN) 0.5 MG tablet    levonorgestrel (MIRENA) 20 MCG/24HR IUD 1 each by Intrauterine route once.   mirtazapine (REMERON) 15 MG tablet Take 1 tablet (15 mg total) by mouth at bedtime.   sertraline (ZOLOFT) 100 MG tablet Take 1 tablet (100 mg total) by mouth daily.   No facility-administered encounter medications on file as of 10/27/2022.    Follow-up: Return in about 6 months (around 04/28/2023) for CPE.   Donell Beers, FNP

## 2022-10-27 NOTE — Assessment & Plan Note (Signed)
She continues to be in remission Patient encouraged to continue to avoid use of illicit drugs

## 2022-10-27 NOTE — Patient Instructions (Signed)
1. Mild intermittent asthma without complication   2. Vitamin D deficiency  - VITAMIN D 25 Hydroxy (Vit-D Deficiency, Fractures)  3. B12 deficiency  - Vitamin B12  4. Screening for thyroid disorder  - TSH  5. Smoker   6. Cocaine use disorder, mild, in sustained remission (HCC)   7. Cannabis use disorder   It is important that you exercise regularly at least 30 minutes 5 times a week as tolerated  Think about what you will eat, plan ahead. Choose " clean, green, fresh or frozen" over canned, processed or packaged foods which are more sugary, salty and fatty. 70 to 75% of food eaten should be vegetables and fruit. Three meals at set times with snacks allowed between meals, but they must be fruit or vegetables. Aim to eat over a 12 hour period , example 7 am to 7 pm, and STOP after  your last meal of the day. Drink water,generally about 64 ounces per day, no other drink is as healthy. Fruit juice is best enjoyed in a healthy way, by EATING the fruit.  [facility] for choosing [facility] we consider it a privelige to serve you.

## 2022-10-27 NOTE — Assessment & Plan Note (Signed)
Checking vitamin D levels.

## 2022-10-27 NOTE — Assessment & Plan Note (Signed)
Continue sertraline 100 mg daily, clonazepam as needed Maintain close follow-up with psychiatry She denies SI, HI

## 2022-10-27 NOTE — Assessment & Plan Note (Signed)
Smokes about  less than a quarter pack/day  Asked about quitting: confirms that he/she currently smokes cigarettes Advise to quit smoking: Educated about QUITTING to reduce the risk of cancer, cardio and cerebrovascular disease. Assess willingness: Unwilling to quit at this time, but is working on cutting back. Assist with counseling and pharmacotherapy: Counseled for 5 minutes and literature provided. Arrange for follow up: follow up in 6 months and continue to offer help.

## 2022-10-28 LAB — TSH: TSH: 0.676 u[IU]/mL (ref 0.450–4.500)

## 2022-10-29 LAB — VITAMIN B12: Vitamin B-12: 305 pg/mL (ref 232–1245)

## 2022-10-29 LAB — VITAMIN D 25 HYDROXY (VIT D DEFICIENCY, FRACTURES): Vit D, 25-Hydroxy: 19.8 ng/mL — ABNORMAL LOW (ref 30.0–100.0)

## 2022-11-10 ENCOUNTER — Other Ambulatory Visit (HOSPITAL_COMMUNITY): Payer: Self-pay | Admitting: Psychiatry

## 2022-11-10 DIAGNOSIS — F410 Panic disorder [episodic paroxysmal anxiety] without agoraphobia: Secondary | ICD-10-CM

## 2022-11-10 DIAGNOSIS — F19982 Other psychoactive substance use, unspecified with psychoactive substance-induced sleep disorder: Secondary | ICD-10-CM

## 2022-11-10 DIAGNOSIS — F332 Major depressive disorder, recurrent severe without psychotic features: Secondary | ICD-10-CM

## 2022-11-15 ENCOUNTER — Telehealth (HOSPITAL_COMMUNITY): Payer: Self-pay

## 2022-11-15 NOTE — Telephone Encounter (Signed)
Called pt to schedule f/u no answer left vm

## 2022-11-16 NOTE — Telephone Encounter (Signed)
Called pt no answer left vm 

## 2022-11-29 ENCOUNTER — Encounter (HOSPITAL_COMMUNITY): Payer: Self-pay

## 2022-12-01 ENCOUNTER — Other Ambulatory Visit (HOSPITAL_COMMUNITY): Payer: Self-pay | Admitting: Psychiatry

## 2022-12-01 DIAGNOSIS — F19982 Other psychoactive substance use, unspecified with psychoactive substance-induced sleep disorder: Secondary | ICD-10-CM

## 2022-12-01 DIAGNOSIS — F410 Panic disorder [episodic paroxysmal anxiety] without agoraphobia: Secondary | ICD-10-CM

## 2022-12-01 DIAGNOSIS — F332 Major depressive disorder, recurrent severe without psychotic features: Secondary | ICD-10-CM

## 2022-12-01 MED ORDER — SERTRALINE HCL 100 MG PO TABS: 100.0000 mg | ORAL_TABLET | Freq: Every day | ORAL | 0 refills | Status: DC

## 2022-12-01 MED ORDER — MIRTAZAPINE 15 MG PO TABS: 15.0000 mg | ORAL_TABLET | Freq: Every day | ORAL | 0 refills | Status: DC

## 2022-12-01 NOTE — Progress Notes (Signed)
Patient was able to schedule follow up appointment; had been leaving phone on do not disturb and confusing our clinic's calls for bill collection. Refills sent of remeron and zoloft.

## 2022-12-09 ENCOUNTER — Telehealth (HOSPITAL_COMMUNITY): Payer: Managed Care, Other (non HMO) | Admitting: Psychiatry

## 2022-12-27 ENCOUNTER — Other Ambulatory Visit (HOSPITAL_COMMUNITY): Payer: Self-pay | Admitting: Psychiatry

## 2022-12-27 DIAGNOSIS — F332 Major depressive disorder, recurrent severe without psychotic features: Secondary | ICD-10-CM

## 2022-12-27 DIAGNOSIS — F410 Panic disorder [episodic paroxysmal anxiety] without agoraphobia: Secondary | ICD-10-CM

## 2022-12-27 DIAGNOSIS — F19982 Other psychoactive substance use, unspecified with psychoactive substance-induced sleep disorder: Secondary | ICD-10-CM

## 2023-01-11 ENCOUNTER — Encounter (HOSPITAL_COMMUNITY): Payer: Self-pay | Admitting: Psychiatry

## 2023-01-11 ENCOUNTER — Telehealth (HOSPITAL_COMMUNITY): Payer: 59 | Admitting: Psychiatry

## 2023-01-11 DIAGNOSIS — F411 Generalized anxiety disorder: Secondary | ICD-10-CM | POA: Diagnosis not present

## 2023-01-11 DIAGNOSIS — E559 Vitamin D deficiency, unspecified: Secondary | ICD-10-CM

## 2023-01-11 DIAGNOSIS — F331 Major depressive disorder, recurrent, moderate: Secondary | ICD-10-CM | POA: Diagnosis not present

## 2023-01-11 DIAGNOSIS — F17201 Nicotine dependence, unspecified, in remission: Secondary | ICD-10-CM

## 2023-01-11 DIAGNOSIS — F1290 Cannabis use, unspecified, uncomplicated: Secondary | ICD-10-CM | POA: Diagnosis not present

## 2023-01-11 DIAGNOSIS — F19982 Other psychoactive substance use, unspecified with psychoactive substance-induced sleep disorder: Secondary | ICD-10-CM

## 2023-01-11 DIAGNOSIS — F410 Panic disorder [episodic paroxysmal anxiety] without agoraphobia: Secondary | ICD-10-CM | POA: Diagnosis not present

## 2023-01-11 DIAGNOSIS — R630 Anorexia: Secondary | ICD-10-CM

## 2023-01-11 DIAGNOSIS — E538 Deficiency of other specified B group vitamins: Secondary | ICD-10-CM

## 2023-01-11 DIAGNOSIS — Z9151 Personal history of suicidal behavior: Secondary | ICD-10-CM

## 2023-01-11 MED ORDER — MIRTAZAPINE 15 MG PO TABS: 15.0000 mg | ORAL_TABLET | Freq: Every day | ORAL | 3 refills | Status: DC

## 2023-01-11 MED ORDER — SERTRALINE HCL 100 MG PO TABS: 100.0000 mg | ORAL_TABLET | Freq: Every day | ORAL | 3 refills | Status: DC

## 2023-01-11 NOTE — Progress Notes (Signed)
BH MD Outpatient Progress Note  01/11/2023 8:57 AM Alexandria Gutierrez  MRN:  [id number]  Assessment:  Alexandria Gutierrez presents for follow-up evaluation. Today, 01/11/23, patient reports continued significantly improved insomnia as she is able to sleep 10 hours per night without issue.  Has also been able to cut back on cannabis use to edibles only on the weekends usually twice daily.  Interpersonally things are improving with her husband and this has led to a little more calm generally with main stressor now being a remodel on their home which has led to some noncompliance with Zoloft and slightly depressed mood.  She is still looking for a psychotherapist and we will look into the [facility] which takes private insurance.  She was able to get a PCP and blood work revealed a vitamin D deficiency for which she is on a supplement.  We will have a nutrition referral soon.  She is content with current doses as in plan below and we will follow-up in 4 months.     For safety, her acute risk factors for suicide are: Substance use, current diagnosis of depression.  Her chronic risk factors for suicide are: 2 past suicide attempts, chronic mental illness, past substance use, history of treatment noncompliance.  Her protective factors are: Employment, minor children living in the home, supportive family, actively seeking and engaging with mental health care, no suicidal ideation, hope for the future.  While future events cannot be fully predicted, she does not currently meet IVC criteria and can be continued as an outpatient.  Identifying Information: Alexandria Gutierrez is a 42 y.o. 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 Initial evaluation of anxiety and depression on 07/10/22; please see that note for full case formulation.  Patient reported worsening symptoms of depression and anxiety in the setting of discontinuing medications several months prior to first appointment and and getting back on Zoloft.  She denied a trauma history but when she was in primary school ended up dropping out of high school because she did not care and was able to get her GED.  Her past substance use history would be suggestive of a prior trauma history but she may not have insight into this or not feel comfortable sharing this.  This was purely a hypothesis.  She described past intravenous use of heroin, methamphetamine as well as smoked in the past and this was her only period of sleeplessness.  She also had a history of cocaine and hallucinogenic mushroom use.  More chronic history of cannabis use and current use is typified by smoking THC-a and cites main benefit is for trying to sleep at night.  Was not having suicidal ideation but more of the sensation of family being off if she were not physically present.  Significant guilt sensations around not being a good mother or wife.  Had an extremely poor nutrition base at time of initial visit with history of vitamin B12 and D deficiencies in the past.  Had been on long-term clonazepam over the last 2 years and uses about 3 times per week but has had periods where she does not use consistently.  She did not benefit from Al[place] and had middling  responses to SSRIs to date but this is in the context of sustained substance use so we will try to cut out cannabis to get a more effective evaluation of Zoloft but will add Remeron to assist with poor appetite and insomnia in the meantime.  She was never diagnosed with attention deficit spectrum disorder and with significant substance use history have lower  suspicion that ADD would be the most accurate diagnosis despite difficulties in maintaining attention at work.  Of note she was able to concentrate with fully adequate attention in initial interview and imagine that combination of undertreated mood disorders and concurrent substance use/benzodiazepine use was largely accounting for difficulties with attention combined with poor sleep and poor nutrition.  Temporarily lost to follow-up after March 2024 appointment.   Plan:   # Major depressive disorder, recurrent, moderate with 2 lifetime suicide attempts Past medication trials: See med trials below Status of problem: improving Interventions: --Continue Zoloft 100 mg once daily --continue Remeron 15 mg nightly (s2/17/24) -- patient to establish with psychotherapy, will try [facility]   # Generalized anxiety disorder with panic attacks  Past medication trials:  Status of problem: improving Interventions: -- zoloft, remeron, pscyhotherapy as above   # Cannabis use disorder rule out substance-induced mood disorder Past medication trials:  Status of problem: improving Interventions: -- Continue to encourage abstinence   # Drug-induced insomnia Past medication trials:  Status of problem: improving Interventions: -- Remeron as above --Cutting back on caffeine and cannabis use as above   # Tobacco use disorder in early remission Past medication trials:  Status of problem: improving Interventions: --Tobacco cessation counseling provided   # Chronic poor appetite with history of vitamin D deficiency and B12 deficiency Past medication trials:  Status of problem: improving Interventions: -- Continue vitamin d and b12 supplement per PCP   # History of cocaine/methamphetamine/hallucinogenic mushroom use disorder in sustained remission  IVDU heroin use 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   Subjective:  Chief Complaint:  Chief Complaint  Patient presents with   Depression   Anxiety   Follow-up   Cannabis use disorder    Interval History: Hasn't established with therapy but also hasn't been looking. 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 Realizing that this is something she does want to pursue. Will meet with a nutritionist soon and has established with primary care. Has been feeling depressed lately and thinks it is related to a remodel taking place at home so doesn't have space like normal. They have also been eating out more. Also with recogntion that she hasn't been taking the zoloft consistently and may be impacting mood but has been regular with the remeron as it sits on her night stand. Sleep overall still good. Has also been able to cut back on THC-a during the week and now limited to weekends; now just edibles and 6mg  of THC 1-2 daily. Caffeine is still 2 cups of half caf coffee in the morning. Nicotine still 5 cigarettes per day. No longer vaping with previously last recorded cigarette on 10/10/2022.    Visit Diagnosis:    ICD-10-CM   1. Major depressive disorder, recurrent episode, moderate (HCC)  F33.1 mirtazapine (REMERON) 15 MG tablet    sertraline (ZOLOFT) 100 MG  tablet    2. Drug-induced insomnia (HCC)  F19.982 mirtazapine (REMERON) 15 MG tablet    3. Generalized anxiety disorder with panic attacks  F41.1 mirtazapine (REMERON) 15 MG tablet   F41.0 sertraline (ZOLOFT) 100 MG tablet    4. Cannabis use disorder  F12.90     5. 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 Vernona Rieger at [facility] and Melony Overly PA-C Hospitalizations: twice as below Suicide attempts: 2 suicide attempts with last one in 2008. First overdose on tylenol PM and got in bathub full of water. Second was kitchen cleaner overdose. SIB: bites nails and picks at scabs. Scratches self in sleep Hx of violence towards others: none Current access to guns: none Hx of abuse: none Substance use: Marijuana use at present, THC-A and purchased as flower. Smoking everyday after work and after children are in bed, to go to sleep. Smoked methamphetamine as above. Went to rehab in 2009, shot heroin, snorted cocaine, mushrooms as well. Last meth use was 3 years ago and 6 weeks.   Past Medical History:  Past Medical History:  Diagnosis Date   Abnormal finding on Pap smear 01/2011   HSIL   Anxiety    Asthma    Cannabis use disorder    Chicken pox    Cigarette smoker    Cocaine use disorder in remission    Depression    History of blood transfusion 01/16/2016   HSIL (high grade squamous intraepithelial lesion) on Pap smear 04/12/2011   Long-term current use of benzodiazepine 07/10/2022   Postpartum care following vaginal delivery (08/04/13) 08/04/2013   SVD (spontaneous vaginal delivery) 08/04/2013   Vitamin B 12 deficiency    Vitamin D deficiency     Past Surgical History:  Procedure Laterality Date   CESAREAN SECTION N/A 01/16/2016   Procedure: CESAREAN SECTION;  Surgeon: Myna Hidalgo, DO;  [facility]: [facility];  Service: Obstetrics;  Laterality: N/A;   CYSTECTOMY Right 15 or 42 years old   on wrist   EXCISION OF ENDOMETRIOMA  2023   MASS EXCISION N/A 12/11/2021   Procedure: EXCISION MASS of abdominal wall;  Surgeon: Luretha Murphy, MD;   Location: [facility];  Service: General;  Laterality: N/A;   WISDOM TOOTH EXTRACTION      Family Psychiatric History: mother with anxiety/depression. Maternal aunt with bipolar/paranoid schizophrenia.   Family History:  Family History  Problem Relation Age of Onset   Endometriosis Mother    Hypertension Mother    Stroke Mother    Alcohol abuse Maternal Aunt    Mental illness Maternal Aunt    Heart disease Maternal Grandmother    Heart disease Maternal Grandfather    Stroke Paternal Grandfather    Colon cancer Neg Hx    Esophageal cancer Neg Hx    Rectal cancer Neg Hx     Social History:  Social History   Socioeconomic History   Marital status: Married    Spouse name: Not on file   Number of children: 3   Years of education: Not on file   Highest education level: Not on file  Occupational History   Occupation: CMA  Tobacco Use   Smoking status: Former    Current packs/day: 0.25    Average packs/day: 0.3 packs/day for 22.0 years (5.5 ttl pk-yrs)    Types: Cigarettes   Smokeless tobacco: Never   Tobacco comments:    Last cigarette since Sunday 10/10/2022.BT CMA   Vaping Use   Vaping status: Never Used  Substance and Sexual Activity   Alcohol use: Not Currently    Comment: rare   Drug use: Yes    Types: Marijuana    Comment: Edibles only as of 01/11/2023 on the weekends   Sexual activity: Yes    Partners: Male    Birth control/protection: I.U.D.  Other Topics Concern   Not on file  Social History Narrative   Lives with her husband.    Social Determinants of Health   Financial Resource Strain: Not on file  Food Insecurity: Not on file  Transportation Needs: Not on file  Physical Activity: Not on file  Stress: Not on file  Social Connections: Not on file    Allergies:  Allergies  Allergen Reactions   Erythromycin Nausea And Vomiting and Other (See Comments)    Spikes high fever to 103   Horse Epithelium Hives   Horse-Derived Products  Swelling    Horses    Current Medications: Current Outpatient Medications  Medication Sig Dispense Refill   b complex vitamins capsule Take 1 capsule by mouth daily.     FIBER COMPLETE PO Take 1 tablet by mouth daily in the afternoon.     Multiple Vitamin (MULTIVITAMIN) tablet Take 1 tablet by mouth daily.     vitamin C (ASCORBIC ACID) 250 MG tablet Take by mouth daily.     albuterol (VENTOLIN HFA) 108 (90 Base) MCG/ACT inhaler Inhale 2 puffs into the lungs every 6 (six) hours as needed for wheezing or shortness of breath. Every 4-6hrs prn 8 g 3   budesonide-formoterol (SYMBICORT) 80-4.5 MCG/ACT inhaler Inhale 2 puffs into the lungs in the morning and at bedtime. 1 each 12   levonorgestrel (MIRENA) 20 MCG/24HR IUD 1 each by Intrauterine route once.     mirtazapine (REMERON) 15 MG tablet Take 1 tablet (15 mg total) by mouth at bedtime. 30 tablet 3   sertraline (ZOLOFT) 100 MG tablet Take 1 tablet (100 mg total) by mouth daily. 30 tablet 3   No current facility-administered medications for this visit.    ROS: Review of Systems  Constitutional:  Positive for appetite change and unexpected weight change.  Respiratory:  Positive for shortness of breath.   Psychiatric/Behavioral:  Positive for decreased concentration. Negative for dysphoric mood, sleep disturbance and suicidal ideas. The patient is nervous/anxious.     Objective:  Psychiatric Specialty Exam: There were no vitals taken for this visit.There is no height or weight on file to calculate BMI.  General Appearance: Casual, Fairly Groomed, and wearing glasses and appears stated age.  Tattoos present  Eye Contact:  Good  Speech:  Clear and Coherent and Normal Rate  Volume:  Normal  Mood:   "Okay I have been a little more depressed"  Affect:  Appropriate, Congruent, Full Range, and also anxious/depressed though significantly less so than initial appointment  Thought Content: Logical and Hallucinations: None   Suicidal Thoughts:   No  Homicidal Thoughts:  No  Thought Process:  Coherent, Goal Directed, and Linear  Orientation:  Full (Time, Place, and Person)    Memory:  Immediate;   Good  Judgment:  Fair still struggling with THC use  Insight:  Fair  Concentration:  Concentration: Fair  Recall:  [employer] of Knowledge: Fair  Language: Fair  Psychomotor Activity:  Normal  Akathisia:  No  AIMS (if indicated): not done  Assets:  Communication Skills Desire for Improvement Financial Resources/Insurance Housing Intimacy Leisure Time Physical Health Resilience Social Support Talents/Skills Transportation Vocational/Educational  ADL's:  Intact  Cognition: WNL  Sleep:  Good   PE: General: sits comfortably in view of camera; no acute distress  Pulm: no increased work of breathing on room air  MSK: all extremity movements appear intact  Neuro: no focal neurological deficits observed  Gait & Station: unable to assess by video    Metabolic Disorder Labs: No results found for: "HGBA1C", "MPG" No results found for: "PROLACTIN" No results found for: "CHOL", "TRIG", "HDL", "CHOLHDL", "VLDL", "LDLCALC" Lab Results  Component Value Date   TSH 0.676 10/27/2022   TSH 0.50 07/29/2017    Therapeutic Level Labs: No results found for: "LITHIUM" No results found for: "VALPROATE" No results found for: "CBMZ"  Screenings:  PHQ2-9    Flowsheet Row Office Visit from 10/27/2022 in [facility] Office Visit from 07/10/20[facility] Office Visit from 07/29/2017 in [facility] [place]  PHQ-2 Total Score 0 6 4  PHQ-9 Total Score 5 23 14       Flowsheet Row ED from 09/22/2022 in [facility] Office Visit from 07/10/2022 in [facility] Admission (Discharged) from 12/11/2021 in [facility]  C-SSRS RISK CATEGORY No Risk No Risk No Risk        Collaboration of Care: Collaboration of Care: Medication Management AEB as above, Primary Care Provider AEB as above, and Referral or follow-up with counselor/therapist AEB as above  Patient/Guardian was advised Release of Information must be obtained prior to any record release in order to collaborate their care with an outside provider. Patient/Guardian was advised if they have not already done so to contact the registration department to sign all necessary forms in order for [place] to release information regarding their care.   Consent: Patient/Guardian gives verbal consent for treatment and assignment of benefits for services provided during this visit. Patient/Guardian expressed understanding and agreed to proceed.   Televisit via video: I connected with Alexandria Gutierrez on 01/11/23 at  8:30 AM EDT by a video enabled telemedicine application and verified that I am speaking with the correct person using two identifiers.  Location: Patient: home Provider: home office   I discussed the limitations of evaluation and management by telemedicine and the availability of in person appointments. The patient expressed understanding and agreed to proceed.  I discussed the assessment and treatment plan with the patient. The patient was provided an opportunity to ask questions and all were answered. The patient agreed with the plan and demonstrated an understanding of the instructions.   The patient was advised to call back or seek an in-person evaluation if the symptoms worsen or if the condition fails to improve as anticipated.  I provided 10 minutes of non-face-to-face time during this encounter.  Elsie Lincoln, MD 01/11/2023, 8:57 AM

## 2023-01-11 NOTE — Patient Instructions (Signed)
We did not make any medication changes today but instead try to focus on finding ways to take your medicine every day, you may want to put the Zoloft on the same nightstand as the Remeron.  Keep up the good work with cutting back on caffeine and marijuana.  Try calling the [facility] to see if he can get established with a therapist or one of the resident providers there.

## 2023-01-12 ENCOUNTER — Encounter: Payer: Self-pay | Admitting: Dietician

## 2023-01-12 ENCOUNTER — Encounter: Payer: 59 | Attending: Nurse Practitioner | Admitting: Dietician

## 2023-01-12 DIAGNOSIS — R635 Abnormal weight gain: Secondary | ICD-10-CM | POA: Diagnosis present

## 2023-01-12 NOTE — Patient Instructions (Addendum)
Goal: start going to the gym 3 days per week for 45-60 minutes.   Goal: eat at least 1 fruit and 1 vegetable every day.   At meals, aim to include 1/2 plate non-starchy vegetables, 1/4 plate protein, and 1/4 plate complex carbs.   Breakfast ideas: Smoothie with frozen fruit, spinach, greek yogurt, milk 2 lower sugar oatmeal packs with spoon of peanut butter Greek yogurt with berries  Lunch ideas: [place] and cheese sandwich on whole wheat with 1/2 plate vegetables and dip or steam broccoli 2 peanut butter whole wheat waffles and a fruit "Adult lunchables": cheese, crackers (triscuits or wheat thins), veggies with dip, fruit

## 2023-01-12 NOTE — Progress Notes (Signed)
Medical Nutrition Therapy  Appointment Start time:  1000  Appointment End time:  71  Primary concerns today: pt states she feels like she does not know what she should be eating   Referral diagnosis: weight gain Preferred learning style: no preference indicated Learning readiness: ready   NUTRITION ASSESSMENT   Anthropometrics   Weight not assessed, pt reports it is 156 lb.   Clinical Medical Hx: anxiety, asthma, blood transfusion, depression, substance abuse Medications: reviewed Labs: 10/27/22 vitamin D 19.8,  Notable Signs/Symptoms: none reported Food Allergies: did not assess  Lifestyle & Dietary Hx  Pt states in February she got put on a new medication and at the time was 118 lb and states she had always been smaller, but states she started gaining weight following this and is now 156 lb.  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 She states her job is more sedentary now.   Pt states she does not have consistent eating times and feels like she snacks all day. Pt states she usually doesn't eat breakfast. She goes home for lunch and has 4 peanut butter waffles or Chickfila on Thursdays with milkshake.  Pt states she has been more hungry from the medication. She states she used to refill her snack drawer every 2 weeks now she does once a week. She states she goes through a large bag of smokehouse almonds weekly and eats crackers with peanut butter and honey throughout the day.  Pt states eating at home has been difficult as she has been undergoing a kitchen remodel and hasn't had a functioning kitchen for 3 weeks. She states they have the fridge and microwave but no sink to wash dishes, so she states they have been getting pizza or mcdonalds. She states she has 2 kids at home.   Pt states she signed up for the gym this morning and wants to start next week. Pt states she started exercising at home last week and does 20 minutes of activity in the evening. Pt states before her 42  year old was born she really enjoyed going to the gym and would go 5 days per week.   Estimated daily fluid intake: 60 oz Supplements: vitamin C, b complex, MVI, fiber Sleep: 10:30-6:30am, 8 hours Stress / self-care: pt states very high, trying to find therapist because stress is so high Current average weekly physical activity: ADLs  24-Hr Dietary Recall First Meal: none Snack: 2 packs captain wafers with peanut butter and honey Second Meal: 4 toaster waffles with peanut butter OR chickfila OR lunch from drug rep.  Snack: kind bar or smokehouse almonds Third Meal: pizza or mcdonalds Snack: cereal or oreos Beverages: coffee with cream and sugar, cirkul water bottle (3 20 oz bottles daily), soda once a week   NUTRITION DIAGNOSIS  NB-1.1 Food and nutrition-related knowledge deficit As related to lack of prior nutrition education by a nutrition professional.  As evidenced by pt report.   NUTRITION INTERVENTION  Nutrition education (E-1) on the following topics:  Fruits & Vegetables: Aim to fill half your plate with a variety of fruits and vegetables. They are rich in vitamins, minerals, and fiber, and can help reduce the risk of chronic diseases. Choose a colorful assortment of fruits and vegetables to ensure you get a wide range of nutrients. Grains and Starches: Make at least half of your grain choices whole grains, such as brown rice, whole wheat bread, and oats. Whole grains provide fiber, which aids in digestion and  healthy cholesterol levels. Aim for whole forms of starchy vegetables such as potatoes, sweet potatoes, beans, peas, and corn, which are fiber rich and provide many vitamins and minerals.  Protein: Incorporate lean sources of protein, such as poultry, fish, beans, nuts, and seeds, into your meals. Protein is essential for building and repairing tissues, staying full, balancing blood sugar, as well as supporting immune function. Dairy: Include low-fat or fat-free dairy  products like milk, yogurt, and cheese in your diet. Dairy foods are excellent sources of calcium and vitamin D, which are crucial for bone health.  Physical Activity: Aim for 60 minutes of physical activity daily. Regular physical activity promotes overall health-including helping to reduce risk for heart disease and diabetes, promoting mental health, and helping [place] sleep better.   Handouts Provided Include  Meal Ideas Plate Method  Learning Style & Readiness for Change Teaching method utilized: Visual & Auditory  Demonstrated degree of understanding via: Teach Back  Barriers to learning/adherence to lifestyle change: none  Goals Established by Pt  Goal: start going to the gym 3 days per week for 45-60 minutes.   Goal: eat at least 1 fruit and 1 vegetable every day.   At meals, aim to include 1/2 plate non-starchy vegetables, 1/4 plate protein, and 1/4 plate complex carbs.   Breakfast ideas: Smoothie with frozen fruit, spinach, greek yogurt, milk 2 lower sugar oatmeal packs with spoon of peanut butter [place] yogurt with berries  Lunch ideas: [place] and cheese sandwich on whole wheat with 1/2 plate vegetables and dip or steam broccoli 2 peanut butter whole wheat waffles and a fruit "Adult lunchables": cheese, crackers (triscuits or wheat thins), veggies with dip, fruit   MONITORING & EVALUATION Dietary intake, weekly physical activity, and follow up in 2 months.  Next Steps  Patient is to call for questions.

## 2023-03-23 ENCOUNTER — Ambulatory Visit (INDEPENDENT_AMBULATORY_CARE_PROVIDER_SITE_OTHER): Payer: 59 | Admitting: Clinical

## 2023-03-23 ENCOUNTER — Encounter (HOSPITAL_COMMUNITY): Payer: Self-pay | Admitting: Clinical

## 2023-03-23 DIAGNOSIS — F1521 Other stimulant dependence, in remission: Secondary | ICD-10-CM

## 2023-03-23 DIAGNOSIS — Z79899 Other long term (current) drug therapy: Secondary | ICD-10-CM

## 2023-03-23 DIAGNOSIS — F331 Major depressive disorder, recurrent, moderate: Secondary | ICD-10-CM

## 2023-03-23 DIAGNOSIS — F411 Generalized anxiety disorder: Secondary | ICD-10-CM

## 2023-03-23 DIAGNOSIS — F1611 Hallucinogen abuse, in remission: Secondary | ICD-10-CM

## 2023-03-23 DIAGNOSIS — F1210 Cannabis abuse, uncomplicated: Secondary | ICD-10-CM | POA: Diagnosis not present

## 2023-03-23 DIAGNOSIS — F410 Panic disorder [episodic paroxysmal anxiety] without agoraphobia: Secondary | ICD-10-CM

## 2023-03-23 DIAGNOSIS — F1411 Cocaine abuse, in remission: Secondary | ICD-10-CM
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
## 2023-03-24 ENCOUNTER — Encounter (HOSPITAL_COMMUNITY): Payer: Self-pay

## 2023-04-07 ENCOUNTER — Other Ambulatory Visit: Payer: Self-pay | Admitting: Obstetrics and Gynecology

## 2023-04-07 DIAGNOSIS — R928 Other abnormal and inconclusive findings on diagnostic imaging of breast: Secondary | ICD-10-CM

## 2023-04-13 ENCOUNTER — Telehealth (HOSPITAL_COMMUNITY): Payer: Self-pay | Admitting: Clinical

## 2023-04-16 NOTE — Telephone Encounter (Signed)
Entered by mistake.  Ambrose Mantle, LCSW 04/16/2023, 8:41 AM

## 2023-04-29 ENCOUNTER — Encounter: Payer: Self-pay | Admitting: Nurse Practitioner

## 2023-04-29 ENCOUNTER — Ambulatory Visit (INDEPENDENT_AMBULATORY_CARE_PROVIDER_SITE_OTHER): Payer: 59 | Admitting: Nurse Practitioner

## 2023-04-29 VITALS — BP 123/62 | HR 76 | Temp 97.5°F | Wt 140.6 lb

## 2023-04-29 DIAGNOSIS — Z13220 Encounter for screening for lipoid disorders: Secondary | ICD-10-CM | POA: Diagnosis not present

## 2023-04-29 DIAGNOSIS — E559 Vitamin D deficiency, unspecified: Secondary | ICD-10-CM

## 2023-04-29 DIAGNOSIS — F1290 Cannabis use, unspecified, uncomplicated: Secondary | ICD-10-CM

## 2023-04-29 DIAGNOSIS — F17200 Nicotine dependence, unspecified, uncomplicated: Secondary | ICD-10-CM

## 2023-04-29 DIAGNOSIS — Z0000 Encounter for general adult medical examination without abnormal findings: Secondary | ICD-10-CM | POA: Insufficient documentation

## 2023-04-29 NOTE — Patient Instructions (Signed)
1. Screening for lipid disorders  - Lipid panel  2. Vitamin D deficiency   3. Cannabis use disorder    It is important that you exercise regularly at least 30 minutes 5 times a week as tolerated  Think about what you will eat, plan ahead. Choose " clean, green, fresh or frozen" over canned, processed or packaged foods which are more sugary, salty and fatty. 70 to 75% of food eaten should be vegetables and fruit. Three meals at set times with snacks allowed between meals, but they must be fruit or vegetables. Aim to eat over a 12 hour period , example 7 am to 7 pm, and STOP after  your last meal of the day. Drink water,generally about 64 ounces per day, no other drink is as healthy. Fruit juice is best enjoyed in a healthy way, by EATING the fruit.  Thanks for choosing [facility] we consider it a privelige to serve you.

## 2023-04-29 NOTE — Assessment & Plan Note (Signed)
Need to avoid smoking marijuana discussed, she verbalized understanding

## 2023-04-29 NOTE — Assessment & Plan Note (Signed)
Annual exam as documented.  ?Counseling done include healthy lifestyle involving committing to 150 minutes of exercise per week, heart healthy diet, and attaining healthy weight. The importance of adequate sleep also discussed.  ?Regular use of seat belt and home safety were also discussed . ?Changes in health habits are decided on by patient with goals and time frames set for achieving them. ?Immunization and cancer screening  needs are specifically addressed at this visit.   ?

## 2023-04-29 NOTE — Assessment & Plan Note (Signed)
Currently not on vitamin D supplement Checking vitamin D levels Last vitamin D Lab Results  Component Value Date   VD25OH 19.8 (L) 10/27/2022

## 2023-04-29 NOTE — Progress Notes (Signed)
Complete physical exam  Patient: Alexandria Gutierrez   DOB: 04-29-1981   42 y.o. Female  MRN: [id number]  Subjective:    Chief Complaint  Patient presents with   Annual Exam    Alexandria Gutierrez is a 42 y.o. female  has a past medical history of Abnormal finding on Pap smear (01/2011), Anxiety, Asthma, Cannabis use disorder, Chicken pox, Cigarette smoker, Cocaine use disorder in remission, Depression, History of blood transfusion (01/16/2016), HSIL (high grade squamous intraepithelial lesion) on Pap smear (04/12/2011), Long-term current use of benzodiazepine (07/10/2022), Postpartum care following vaginal delivery (08/04/13) (08/04/2013), SVD (spontaneous vaginal delivery) (08/04/2013), Vitamin B 12 deficiency, and Vitamin D deficiency. who presents today for a complete physical exam. She reports consuming a general diet. Goes to the GYM 5 days a week 45 minutes  on each days She generally feels well. She reports sleeping well.  Stated that she has been able to get off her anxiety and depression medication since she started exercising  She plans getting her flu vaccine at work Wears prescription glasses plans to get her yearly eye exam done in February or March next year Up-to-date with breast cancer screening    Most recent fall risk assessment:    01/12/2023   10:02 AM  Fall Risk   Falls in the past year? 0     Most recent depression screenings:    04/29/2023    2:43 PM 03/23/2023    3:10 PM  PHQ 2/9 Scores  PHQ - 2 Score 1   PHQ- 9 Score 5      Information is confidential and restricted. Go to Review Flowsheets to unlock data.        Patient Care Team: Donell Beers, FNP as PCP - General (Nurse Practitioner)   Outpatient Medications Prior to Visit  Medication Sig   albuterol (VENTOLIN HFA) 108 (90 Base) MCG/ACT inhaler Inhale 2 puffs into the lungs every 6 (six) hours as needed for wheezing or shortness of breath. Every 4-6hrs prn   budesonide-formoterol  (SYMBICORT) 80-4.5 MCG/ACT inhaler Inhale 2 puffs into the lungs in the morning and at bedtime.   levonorgestrel (MIRENA) 20 MCG/24HR IUD 1 each by Intrauterine route once.   [DISCONTINUED] b complex vitamins capsule Take 1 capsule by mouth daily.   [DISCONTINUED] FIBER COMPLETE PO Take 1 tablet by mouth daily in the afternoon.   [DISCONTINUED] mirtazapine (REMERON) 15 MG tablet Take 1 tablet (15 mg total) by mouth at bedtime.   [DISCONTINUED] Multiple Vitamin (MULTIVITAMIN) tablet Take 1 tablet by mouth daily.   [DISCONTINUED] sertraline (ZOLOFT) 100 MG tablet Take 1 tablet (100 mg total) by mouth daily.   [DISCONTINUED] vitamin C (ASCORBIC ACID) 250 MG tablet Take by mouth daily.   No facility-administered medications prior to visit.    Review of Systems  Constitutional:  Negative for appetite change, chills, fatigue and fever.  HENT:  Negative for congestion, postnasal drip, rhinorrhea and sneezing.   Eyes:  Negative for pain, discharge and itching.  Respiratory:  Negative for cough, shortness of breath and wheezing.   Cardiovascular:  Negative for chest pain, palpitations and leg swelling.  Gastrointestinal:  Negative for abdominal pain, constipation, nausea and vomiting.  Endocrine: Negative for cold intolerance, heat intolerance and polydipsia.  Genitourinary:  Negative for difficulty urinating, dysuria, flank pain and frequency.  Musculoskeletal:  Negative for arthralgias, back pain, joint swelling and myalgias.  Skin:  Negative for color change, pallor, rash and wound.  Neurological:  Negative for dizziness,  facial asymmetry, weakness, numbness and headaches.  Psychiatric/Behavioral:  Negative for behavioral problems, confusion, self-injury and suicidal ideas.        Objective:     BP 123/62   Pulse 76   Temp (!) 97.5 F (36.4 C)   Wt 140 lb 9.6 oz (63.8 kg)   SpO2 99%   BMI 24.91 kg/m    Physical Exam Vitals and nursing note reviewed.  Constitutional:       General: She is not in acute distress.    Appearance: Normal appearance. She is not ill-appearing, toxic-appearing or diaphoretic.  HENT:     Right Ear: Tympanic membrane, ear canal and external ear normal. There is no impacted cerumen.     Left Ear: Tympanic membrane, ear canal and external ear normal. There is no impacted cerumen.     Nose: Nose normal. No congestion or rhinorrhea.     Mouth/Throat:     Mouth: Mucous membranes are moist.     Pharynx: Oropharynx is clear. No oropharyngeal exudate or posterior oropharyngeal erythema.  Eyes:     General: No scleral icterus.       Right eye: No discharge.        Left eye: No discharge.     Extraocular Movements: Extraocular movements intact.     Conjunctiva/sclera: Conjunctivae normal.  Neck:     Vascular: No carotid bruit.  Cardiovascular:     Rate and Rhythm: Normal rate and regular rhythm.     Pulses: Normal pulses.     Heart sounds: Normal heart sounds. No murmur heard.    No friction rub. No gallop.  Pulmonary:     Effort: Pulmonary effort is normal. No respiratory distress.     Breath sounds: Normal breath sounds. No stridor. No wheezing, rhonchi or rales.  Chest:     Chest wall: No tenderness.  Abdominal:     General: Bowel sounds are normal. There is no distension.     Palpations: Abdomen is soft. There is no mass.     Tenderness: There is no abdominal tenderness. There is no right CVA tenderness, left CVA tenderness, guarding or rebound.     Hernia: No hernia is present.  Musculoskeletal:        General: No swelling, tenderness, deformity or signs of injury.     Cervical back: Normal range of motion and neck supple. No rigidity or tenderness.     Right lower leg: No edema.     Left lower leg: No edema.  Lymphadenopathy:     Cervical: No cervical adenopathy.  Skin:    General: Skin is warm and dry.     Capillary Refill: Capillary refill takes less than 2 seconds.     Coloration: Skin is not jaundiced or pale.      Findings: No bruising, erythema, lesion or rash.  Neurological:     Mental Status: She is alert and oriented to person, place, and time.     Cranial Nerves: No cranial nerve deficit.     Sensory: No sensory deficit.     Motor: No weakness.     Coordination: Coordination normal.     Gait: Gait normal.     Deep Tendon Reflexes: Reflexes normal.  Psychiatric:        Mood and Affect: Mood normal.        Behavior: Behavior normal.        Thought Content: Thought content normal.        Judgment: Judgment normal.  No results found for any visits on 04/29/23.     Assessment & Plan:    Routine Health Maintenance and Physical Exam  Immunization History  Administered Date(s) Administered   Influenza-Unspecified 02/21/2017, 02/21/2018   PFIZER(Purple Top)SARS-COV-2 Vaccination 05/14/2020   Tdap 12/31/2015    Health Maintenance  Topic Date Due   COVID-19 Vaccine (2 - Pfizer risk series) 06/04/2020   INFLUENZA VACCINE  08/22/2023 (Originally 12/23/2022)   Cervical Cancer Screening (HPV/Pap Cotest)  04/23/2025   DTaP/Tdap/Td (2 - Td or Tdap) 12/30/2025   HIV Screening  Completed   HPV VACCINES  Aged Out   Hepatitis C Screening  Discontinued    Discussed health benefits of physical activity, and encouraged her to engage in regular exercise appropriate for her age and condition.  Problem List Items Addressed This Visit       Other   Cannabis use disorder (Chronic)    Need to avoid smoking marijuana discussed, she verbalized understanding      Vitamin D deficiency (Chronic)    Currently not on vitamin D supplement Checking vitamin D levels Last vitamin D Lab Results  Component Value Date   VD25OH 19.8 (L) 10/27/2022         Relevant Orders   VITAMIN D 25 Hydroxy (Vit-D Deficiency, Fractures)   Light tobacco smoker    Smokes 1 to 4 cigarettes on some days Cessation encouraged      Annual physical exam    Annual exam as documented.  Counseling done include healthy  lifestyle involving committing to 150 minutes of exercise per week, heart healthy diet, and attaining healthy weight. The importance of adequate sleep also discussed.  Regular use of seat belt and home safety were also discussed . Changes in health habits are decided on by patient with goals and time frames set for achieving them. Immunization and cancer screening  needs are specifically addressed at this visit.         Other Visit Diagnoses     Screening for lipid disorders    -  Primary   Relevant Orders   Lipid panel      Return in about 1 year (around 04/28/2024) for CPE.     Donell Beers, FNP

## 2023-04-29 NOTE — Assessment & Plan Note (Signed)
Smokes 1 to 4 cigarettes on some days Cessation encouraged

## 2023-05-03 ENCOUNTER — Other Ambulatory Visit: Payer: Self-pay

## 2023-05-05 ENCOUNTER — Other Ambulatory Visit: Payer: Self-pay

## 2023-05-13 ENCOUNTER — Other Ambulatory Visit: Payer: 59

## 2023-06-07 ENCOUNTER — Telehealth (INDEPENDENT_AMBULATORY_CARE_PROVIDER_SITE_OTHER): Payer: Self-pay | Admitting: Clinical

## 2023-06-09 NOTE — Telephone Encounter (Signed)
No documentation needed - other than having front office staff to contact patient about cancellations to see if she wants to take an appointment, there has been no communication.  She has not yet opted to take any of those openings.  Ambrose Mantle, LCSW 06/09/2023, 8:58 AM

## 2023-06-17 ENCOUNTER — Ambulatory Visit
Admission: RE | Admit: 2023-06-17 | Discharge: 2023-06-17 | Disposition: A | Discharge status: Home / Self Care | Payer: 59 | Source: Ambulatory Visit | Attending: Obstetrics and Gynecology | Admitting: Obstetrics and Gynecology

## 2023-06-17 ENCOUNTER — Ambulatory Visit: Payer: 59

## 2023-06-17 DIAGNOSIS — R928 Other abnormal and inconclusive findings on diagnostic imaging of breast: Secondary | ICD-10-CM

## 2023-06-24 ENCOUNTER — Encounter (HOSPITAL_COMMUNITY): Payer: Self-pay | Admitting: Clinical

## 2023-06-24 ENCOUNTER — Ambulatory Visit (INDEPENDENT_AMBULATORY_CARE_PROVIDER_SITE_OTHER): Payer: 59 | Admitting: Clinical

## 2023-06-24 DIAGNOSIS — F331 Major depressive disorder, recurrent, moderate: Secondary | ICD-10-CM

## 2023-06-24 DIAGNOSIS — F411 Generalized anxiety disorder: Secondary | ICD-10-CM

## 2023-06-24 DIAGNOSIS — F410 Panic disorder [episodic paroxysmal anxiety] without agoraphobia: Secondary | ICD-10-CM

## 2023-06-24 NOTE — Progress Notes (Unsigned)
THERAPIST PROGRESS NOTE  Session Time: 8:00-9:00am  Session #2  Participation Level: Active  Behavioral Response: Casual Alert Euthymic  Type of Therapy: Individual Therapy  Treatment Goals addressed:  Goal: LTG: Charmeka's symptom of lack of concentration will exhibit functional improvement AEB no additional write-ups at work Goal: LTG: Laticia will demonstrate increased independent task completion specifically at work AEB co-workers noticing and having to look for her less often Goal: STG: Score less than 5 on the GAD-7 as evidenced by intermittent administration of the questionnaire to determine progress in management of anxiety.   Goal: STG: Learn about the feeling of anxiety and its many variations, the cycle of anxiety and how to interrupt that cycle.   Goal: STG: Learn about boundary types, how to implement them, and how to enforce them so that patient feels more empowered and content with being able to maintain more helpful, appropriate boundaries in the future for a more balanced result.   Goal: LTG: Learn breathing techniques and grounding techniques at an age-appropriate level and demonstrate mastery in session then report independent use of these skills out of session.   Goal: STG: Learn and practice communication techniques such as "I" statements, open-ended questions, reflective listening, assertiveness, fair fighting rules, initiating conversations, and more as necessary and taught in session   Goal: LTG: Score less than 9 on the Patient Health Questionnaire (PHQ-9) as evidenced by intermittent administration of the questionnaire to determine progress.   Goal: LTG: Patient will process life events to the extent needed so that patient is able to move forward with various areas of life in a better frame of mind per self-report   Goal: STG: Learn a variety of coping skills and demonstrate the ability to use them to decrease feelings of sadness, anger, and fear and increase  feelings of happiness, peace, and powerfulness AEB gauging those emotions on 1-10 scale. Goal: LTG: Explore personal core beliefs, rules and assumptions, and cognitive distortions through therapist using Cognitive Behavioral Therapy; learn how to develop replacement thoughts and challenge unhelpful thoughts.   Goal: LTG: Brunette will improve quality of life by maintaining ongoing abstinence from previous problematic mood-altering substances Goal: LTG: Look at reasons to continue marijuana and reasons to stop in order to determine Stage of Change; as necessary, move through the Stages of Change using a Decisional Balance Exercise and Motivational Interviewing.   Goal: STG: Shaden will consider and decide on whether to resume attendance at recovery-focused peer-led support group (Substance Use Support Groups: NA) Goal: LTG: Develop a "Wellness Toolbox" of coping skills, music, affirmations, phone numbers, meetings and such to be used in times of crisis.  ProgressTowards Goals: Progressing  Interventions: Supportive and Other: detachment, boundaries, communication  Summary: Tru Lauranne Beyersdorf is a 43 y.o. female who presents with MDD and GAD, was treated in the [place] office but is now off medicine.  She has a background of illicit drug abuse, but has been clean since 2009 except for a 3-week relapse 4 years ago and her current daily use of marijuana.  She presented oriented x5 and stated she was feeling "up and down."  CSW evaluated patient's medication compliance, use of coping tools, and self-care, as applicable.   She has continued to be bothered by her husband's excessive drinking which she believes is alcoholism.  They had a talk 2-3 days ago and he agreed to stop drinking for 1 month, making it clear that he would then "reevaluate."  She shared that she told him he  weaponizes the fact that there re many things he cannot do because he does not know how, such as clean/seasoning their iron  skillet.  CSW provided positive feedback about what seemed to be "I" language, if her report was accurate.  CSW explained the difference in someone feeling attacked when we use the word "You" and feeling we are making a personal appeal when using "I" statements.  Her understanding of this was verbalized.  The remainder of the session was spent processing long-term issues with husband's best friend who is a female and the various problems in patient's relationship caused by their relationship with each other.  He compares patient to his friend, will not tell patient when he is going to stop by friend's house and yet stays until early hours of the morning.  The friend herself makes negative comments and has even banned them from bringing their children over, only to insist (while inebriated) on them bringing the children over.  When this happened, husband could not understand patient's decision to not do so because the friend was under the influence when she said this so it still felt to patient like it would be a violation.   Additionally, this week, patient overheard her husband talking with his friend and laying the blame for him not coming over as much on the patient, which is not what he had said he would tell her when patient and he talked about it. He very much gave the impression that patient is forbidding him to do certain things which is not true. She states the only limitation she ever put on their relationship was 13 years ago when she asked that if he was going to be late, that he text her so as to prevent worry.  This same request was made of him going to hang out with female friends.  Patient is going on a girls' vacation soon with his friend and some other friends.  We talked about whether she could have a talk with Nicki Guadal[place]ara to inform her that this is the only restriction she has ever placed, see if she could clear the communication up since her husband being the "go-between" has been less than effective.   She feels she could talk with her, but only with a third person present to prevent misunderstandings.  She immediately identified another lady going on the trip who could be that third person.  CSW gave her positive strokes for how she is handling the situation.    CSW provided a pictorial handout and psychoeducation about types of boundaries, including porous, rigid, and healthy.  We discussed how boundaries define what is acceptable and unacceptable within a relationship, with an emphasis that boundaries are present whether we have spoken aloud about them or not.  Porous boundaries were described as "letting everything in whether it is healthy or unhealthy, not saying no when we want to, oversharing, avoiding conflict by giving in, trusting too much and without reason to do so, and communicating passively."  Rigid boundaries were described as "not letting anything in regardless of whether it is healthy or unhealthy, saying no most of the time regardless of circumstance, avoiding conflict by pushing others away, being inflexible, not trusting even those who have not shown reason to distrust, and communicating aggressively."  Healthy boundaries were described as "letting in the health and not letting in the unhealthy, taking time to build trust with other people, saying no when it is needed, accepting conflict as a normal part of life,  sharing personal information appropriately, and communicating assertively."  CSW emphasized that if we wish to change the current boundaries in a relationship, it is only fair to tell the other person what the new expectations are so that they have the opportunity to choose to respect them, if they wish, which would enable [place] to then experience positive growth in that relationship.  Patient verbalized good understanding of concepts about boundaries as presented. It was reiterated to her that if she chooses to try to resolve the conflict with Nicki Guadal[place], that is a display of healthy  boundaries.  Suicidal/Homicidal: No without intent/plan  Therapist Response: Patient is progressing AEB engaging in scheduled therapy session.  Throughout the session, CSW gave patient the opportunity to explore thoughts and feelings associated with current life situations and past/present stressors.   CSW challenged patient gently and appropriately to consider different ways of looking at reported issues. CSW encouraged patient's expression of feelings and validated these using empathy, active listening, open body language, and unconditional positive regard.   CSW encouraged patient to schedule more therapy sessions for the future, as needed.  She was told about the delay in next appointment due to availability issues and was told about groups.  Plan: Return again at first available appointment then every 2 weeks.  Recommendations:  Return to therapy every 2 weeks, engage in self care behaviors as explored in session, think about and implement boundaries as desired based on what we discussed in session  Diagnosis:  Generalized anxiety disorder with panic attacks  Major depressive disorder, recurrent episode, moderate (HCC)  Collaboration of Care: Other - none known at this time  Patient/Guardian was advised Release of Information must be obtained prior to any record release in order to collaborate their care with an outside provider. Patient/Guardian was advised if they have not already done so to contact the registration department to sign all necessary forms in order for [place] to release information regarding their care.   Consent: Patient/Guardian gives verbal consent for treatment and assignment of benefits for services provided during this visit. Patient/Guardian expressed understanding and agreed to proceed.   Lynnell Chad, LCSW 06/24/2023

## 2023-09-09 ENCOUNTER — Ambulatory Visit (HOSPITAL_COMMUNITY): Payer: Self-pay | Admitting: Clinical

## 2023-09-23 ENCOUNTER — Encounter (HOSPITAL_COMMUNITY): Payer: Self-pay | Admitting: Clinical

## 2023-09-23 ENCOUNTER — Ambulatory Visit (INDEPENDENT_AMBULATORY_CARE_PROVIDER_SITE_OTHER): Payer: Self-pay | Admitting: Clinical

## 2023-09-23 DIAGNOSIS — F1210 Cannabis abuse, uncomplicated: Secondary | ICD-10-CM

## 2023-09-23 DIAGNOSIS — F1411 Cocaine abuse, in remission: Secondary | ICD-10-CM

## 2023-09-23 DIAGNOSIS — F1521 Other stimulant dependence, in remission: Secondary | ICD-10-CM | POA: Diagnosis not present

## 2023-09-23 DIAGNOSIS — F1611 Hallucinogen abuse, in remission: Secondary | ICD-10-CM

## 2023-09-23 DIAGNOSIS — F410 Panic disorder [episodic paroxysmal anxiety] without agoraphobia: Secondary | ICD-10-CM

## 2023-09-23 DIAGNOSIS — F331 Major depressive disorder, recurrent, moderate: Secondary | ICD-10-CM | POA: Diagnosis not present

## 2023-09-23 DIAGNOSIS — F411 Generalized anxiety disorder: Secondary | ICD-10-CM | POA: Diagnosis not present

## 2023-09-23 NOTE — Progress Notes (Signed)
 THERAPIST PROGRESS NOTE  Session Time: 8:02-9:00am  Session #3  Participation Level: Active  Behavioral Response: Casual Alert Anxious, Depressed, and Tearful  Type of Therapy: Individual Therapy  Treatment Goals addressed:  New treatment goals established, current goals reviewed:  LTG: Patient will process life events to the extent needed so that patient is able to move forward with various areas of life in a better frame of mind per self-report   STG: Learn a variety of coping skills and demonstrate the ability to use them to decrease feelings of sadness, anger, and fear and increase feelings of happiness, peace, and powerfulness AEB gauging those emotions on 1-10 scale.  LTG: Explore personal core beliefs, rules and assumptions, and cognitive distortions through therapist using Cognitive Behavioral Therapy; learn how to develop replacement thoughts and challenge unhelpful thoughts.    STG: Learn about boundary types, how to implement them, and how to enforce them so that patient feels more empowered and content with being able to maintain more helpful, appropriate boundaries in the future for a more balanced result.    LTG: Learn breathing techniques and grounding techniques at an age-appropriate level and demonstrate mastery in session then report independent use of these skills out of session.    STG: Learn and practice communication techniques such as "I" statements, open-ended questions, reflective listening, assertiveness, fair fighting rules, initiating conversations, and more as necessary and taught in session    LTG: Alexandria Gutierrez will improve quality of life by maintaining ongoing abstinence from previous problematic mood-altering substances  LTG: Look at reasons to continue marijuana and reasons to stop in order to determine Stage of Change; as necessary, move through the Stages of Change using a Decisional Balance Exercise and Motivational Interviewing.    STG: Alexandria Gutierrez will consider and  decide on whether to resume attendance at recovery-focused peer-led support group (Substance Use Support Groups: NA)  LTG: Alexandria Gutierrez's symptom of lack of concentration will exhibit functional improvement AEB no additional write-ups at work  LTG: Alexandria Gutierrez will demonstrate increased independent task completion specifically at work AEB co-workers noticing and having to look for her less often  LTG: Score less than 9 on the PHQ-9 and less than 5 on the GAD-7 as evidenced by intermittent administration of the questionnaires to determine progress in managing depression and anxiety.  ProgressTowards Goals: Progressing  Interventions: Psychosocial Skills: boundaries-expanded, Supportive, and Other: detachment, gratitude  Summary: Alexandria Gutierrez is a 43 y.o. female who presents with MDD and GAD, was treated in the [place] office but is now off medicine.  She presented oriented x5 and stated she was feeling "good."  CSW evaluated patient's medication compliance, use of coping tools, and self-care, as applicable.  She provided an update on various aspects of her life that are normally discussed in therapy, including girls' weekend that she had discussed at last session and a variety of issues occurring during recent months with husband.   We explored how removed her husband is from others including herself emotionally, other than what presents in front of him in the present moment.  This was highlighted recently by the one female friend having a completely different image of him than his wife does, being shocked when told about last summer's hurtful words to wife/patient about her weight gain and his lack of attraction as a result.  Patient was on Remeron  at the time of her weight gain and subsequent lost the weight.  She stated she and her husband are still working through the trauma that caused  between them.  Multiple times she stated about various topics, "I need him to understand that."  Using a CBT  approach, CSW gently challenged her that she does not NEED him to understand but would like him to do so, that it might even be BEST if he did.  CSW also challenged the notion that understanding is what she seeks, positing that it is hearing and acting on what he hears that she seeks, rather than understanding.  She will consider these ideas.  CSW also suggested that they might benefit from utilizing gratitude toward each other on a daily basis to shift the focus away from what is wrong to what is right in the relationship.  Prior to the end of session, CSW provided her with a Boundaries packet and reviewed boundary styles, boundary types, and how to set boundaries.  This was discussed in terms of not only the patient's boundaries, but also her husband's display of mostly rigid boundaries.  She was encouraged to read the information more thoroughly and perhaps even discuss it with her husband if she feels comfortable doing so.  Suicidal/Homicidal: No without intent/plan  Therapist Response: Patient is progressing AEB engaging in scheduled therapy session.  Throughout the session, CSW gave patient the opportunity to explore thoughts and feelings associated with current life situations and past/present stressors.   CSW challenged patient gently and appropriately to consider different ways of looking at reported issues. CSW encouraged patient's expression of feelings and validated these using empathy, active listening, open body language, and unconditional positive regard.     Plan/Recommendations:  Return to therapy at next scheduled appointment on 7/3, call in to see if can get a sooner appointment, continue to work on healthy boundaries, suggest gratitude exercise to and do this with husband  Diagnosis:  Generalized anxiety disorder with panic attacks  Major depressive disorder, recurrent episode, moderate (HCC)  Mild cannabis use disorder  Methamphetamine use disorder, moderate, in sustained  remission (HCC)  Cocaine use disorder, mild, in sustained remission (HCC)  Hallucinogenic mushrooms use disorder, mild, in sustained remission (HCC)  Collaboration of Care: Other - none known at this time  Patient/Guardian was advised Release of Information must be obtained prior to any record release in order to collaborate their care with an outside provider. Patient/Guardian was advised if they have not already done so to contact the registration department to sign all necessary forms in order for us  to release information regarding their care.   Consent: Patient/Guardian gives verbal consent for treatment and assignment of benefits for services provided during this visit. Patient/Guardian expressed understanding and agreed to proceed.   Ancel Kass, LCSW 09/23/2023

## 2023-10-07 ENCOUNTER — Ambulatory Visit (HOSPITAL_COMMUNITY): Payer: Self-pay | Admitting: Clinical

## 2023-10-21 ENCOUNTER — Ambulatory Visit (HOSPITAL_COMMUNITY): Payer: Self-pay | Admitting: Clinical

## 2023-11-24 ENCOUNTER — Ambulatory Visit (HOSPITAL_COMMUNITY): Payer: Self-pay | Admitting: Clinical

## 2024-04-09 ENCOUNTER — Encounter (HOSPITAL_COMMUNITY): Payer: Self-pay | Admitting: Clinical
